# Patient Record
Sex: Female | Born: 1980 | Race: White | Smoking: Never smoker
Health system: Northeastern US, Academic
[De-identification: ages and names within clinical notes are randomized; demographics above are authoritative.]

## PROBLEM LIST (undated history)

## (undated) DIAGNOSIS — R87619 Unspecified abnormal cytological findings in specimens from cervix uteri: Secondary | ICD-10-CM

## (undated) DIAGNOSIS — IMO0002 Reserved for concepts with insufficient information to code with codable children: Secondary | ICD-10-CM

## (undated) DIAGNOSIS — I82409 Acute embolism and thrombosis of unspecified deep veins of unspecified lower extremity: Secondary | ICD-10-CM

## (undated) DIAGNOSIS — Z8679 Personal history of other diseases of the circulatory system: Secondary | ICD-10-CM

## (undated) DIAGNOSIS — F329 Major depressive disorder, single episode, unspecified: Secondary | ICD-10-CM

## (undated) DIAGNOSIS — I4891 Unspecified atrial fibrillation: Secondary | ICD-10-CM

## (undated) DIAGNOSIS — R002 Palpitations: Secondary | ICD-10-CM

## (undated) DIAGNOSIS — F419 Anxiety disorder, unspecified: Secondary | ICD-10-CM

## (undated) DIAGNOSIS — F32A Depression, unspecified: Secondary | ICD-10-CM

## (undated) DIAGNOSIS — H0013 Chalazion right eye, unspecified eyelid: Secondary | ICD-10-CM

## (undated) DIAGNOSIS — I471 Supraventricular tachycardia: Secondary | ICD-10-CM

## (undated) DIAGNOSIS — I4719 Other supraventricular tachycardia: Secondary | ICD-10-CM

## (undated) HISTORY — DX: Other supraventricular tachycardia: I47.19

## (undated) HISTORY — PX: KNEE SURGERY: SHX244

## (undated) HISTORY — DX: Chalazion right eye, unspecified eyelid: H00.13

## (undated) HISTORY — DX: Supraventricular tachycardia: I47.1

## (undated) HISTORY — PX: TUBAL LIGATION: SHX77

## (undated) HISTORY — PX: HERNIA REPAIR: SHX51

## (undated) HISTORY — DX: Anxiety disorder, unspecified: F41.9

## (undated) HISTORY — DX: Palpitations: R00.2

## (undated) HISTORY — PX: TONSILLECTOMY: SUR1361

## (undated) HISTORY — DX: Unspecified atrial fibrillation: I48.91

## (undated) HISTORY — PX: OTHER SURGICAL HISTORY: SHX169

## (undated) HISTORY — DX: Acute embolism and thrombosis of unspecified deep veins of unspecified lower extremity: I82.409

## (undated) HISTORY — PX: KNEE ARTHROCENTESIS: SUR44

## (undated) HISTORY — PX: KNEE ARTHROPLASTY: SHX992

---

## 2004-02-16 DIAGNOSIS — I82409 Acute embolism and thrombosis of unspecified deep veins of unspecified lower extremity: Secondary | ICD-10-CM

## 2004-02-16 HISTORY — DX: Acute embolism and thrombosis of unspecified deep veins of unspecified lower extremity: I82.409

## 2007-01-31 ENCOUNTER — Inpatient Hospital Stay (HOSPITAL_COMMUNITY): Admission: AD | Admit: 2007-01-31 | Discharge: 2007-01-31 | Payer: Self-pay | Admitting: Obstetrics and Gynecology

## 2007-02-04 ENCOUNTER — Inpatient Hospital Stay (HOSPITAL_COMMUNITY): Admission: AD | Admit: 2007-02-04 | Discharge: 2007-02-05 | Payer: Self-pay | Admitting: Obstetrics and Gynecology

## 2007-02-13 ENCOUNTER — Inpatient Hospital Stay (HOSPITAL_COMMUNITY): Admission: AD | Admit: 2007-02-13 | Discharge: 2007-02-15 | Payer: Self-pay | Admitting: Obstetrics and Gynecology

## 2007-12-13 ENCOUNTER — Emergency Department (HOSPITAL_COMMUNITY): Admission: EM | Admit: 2007-12-13 | Discharge: 2007-12-13 | Payer: Self-pay | Admitting: Family Medicine

## 2008-09-08 ENCOUNTER — Inpatient Hospital Stay (HOSPITAL_COMMUNITY): Admission: AD | Admit: 2008-09-08 | Discharge: 2008-09-09 | Payer: Self-pay | Admitting: Obstetrics and Gynecology

## 2008-09-09 ENCOUNTER — Inpatient Hospital Stay (HOSPITAL_COMMUNITY): Admission: AD | Admit: 2008-09-09 | Discharge: 2008-09-11 | Payer: Self-pay | Admitting: Obstetrics and Gynecology

## 2010-02-15 NOTE — L&D Delivery Note (Signed)
Delivery Note At 6:34 PM a viable female was delivered via precipitous Vaginal, Spontaneous Delivery (Presentation: Right Occiput Anterior) in the bed.  APGAR: 7, 9; weight 9 lb 1 oz (4111 g).   Placenta status: Intact, Spontaneous with 3VC.  Cord was clamped and cut and placed on mothers abdomen. Good uterine firming with fundal massage and pitocin. Cord:  with the following complications: None.  Cord pH: n/a  Anesthesia: Epidural  Episiotomy: None Lacerations: None Est. Blood Loss (mL): 350  Mom to postpartum.  Baby to nursery-stable.  Rachel Parsons 10/01/2010, 6:53 PM

## 2010-03-16 ENCOUNTER — Other Ambulatory Visit: Payer: Self-pay | Admitting: Obstetrics & Gynecology

## 2010-03-16 DIAGNOSIS — Z3682 Encounter for antenatal screening for nuchal translucency: Secondary | ICD-10-CM

## 2010-03-16 LAB — ABO/RH

## 2010-03-16 LAB — SYPHILIS: RPR W/REFLEX TO RPR TITER AND TREPONEMAL ANTIBODIES, TRADITIONAL SCREENING AND DIAGNOSIS ALGORITHM: RPR: NONREACTIVE

## 2010-03-16 LAB — HIV ANTIBODY (ROUTINE TESTING W REFLEX): HIV: NONREACTIVE

## 2010-03-16 LAB — RUBELLA ANTIBODY, IGM: Rubella: IMMUNE

## 2010-03-16 LAB — HEPATITIS B SURFACE ANTIGEN: Hepatitis B Surface Ag: NEGATIVE

## 2010-03-16 LAB — ANTIBODY SCREEN: Antibody Screen: NEGATIVE

## 2010-03-27 ENCOUNTER — Ambulatory Visit (HOSPITAL_COMMUNITY)
Admission: RE | Admit: 2010-03-27 | Discharge: 2010-03-27 | Disposition: A | Discharge status: Home / Self Care | Payer: Medicaid Other | Source: Ambulatory Visit | Attending: Obstetrics & Gynecology | Admitting: Obstetrics & Gynecology

## 2010-03-27 DIAGNOSIS — O351XX0 Maternal care for (suspected) chromosomal abnormality in fetus, not applicable or unspecified: Secondary | ICD-10-CM | POA: Insufficient documentation

## 2010-03-27 DIAGNOSIS — O3510X0 Maternal care for (suspected) chromosomal abnormality in fetus, unspecified, not applicable or unspecified: Secondary | ICD-10-CM | POA: Insufficient documentation

## 2010-03-27 DIAGNOSIS — Z3682 Encounter for antenatal screening for nuchal translucency: Secondary | ICD-10-CM

## 2010-03-27 DIAGNOSIS — Z3689 Encounter for other specified antenatal screening: Secondary | ICD-10-CM | POA: Insufficient documentation

## 2010-04-30 ENCOUNTER — Other Ambulatory Visit: Payer: Self-pay | Admitting: Obstetrics & Gynecology

## 2010-04-30 DIAGNOSIS — Z3689 Encounter for other specified antenatal screening: Secondary | ICD-10-CM

## 2010-05-08 ENCOUNTER — Ambulatory Visit (HOSPITAL_COMMUNITY): Payer: Medicaid Other

## 2010-05-08 ENCOUNTER — Encounter (HOSPITAL_COMMUNITY): Payer: Self-pay

## 2010-05-08 ENCOUNTER — Other Ambulatory Visit: Payer: Self-pay | Admitting: Obstetrics & Gynecology

## 2010-05-08 ENCOUNTER — Ambulatory Visit (HOSPITAL_COMMUNITY)
Admission: RE | Admit: 2010-05-08 | Discharge: 2010-05-08 | Disposition: A | Discharge status: Home / Self Care | Payer: Medicaid Other | Source: Ambulatory Visit | Attending: Obstetrics & Gynecology | Admitting: Obstetrics & Gynecology

## 2010-05-08 DIAGNOSIS — Z0489 Encounter for examination and observation for other specified reasons: Secondary | ICD-10-CM

## 2010-05-08 DIAGNOSIS — IMO0002 Reserved for concepts with insufficient information to code with codable children: Secondary | ICD-10-CM

## 2010-05-08 DIAGNOSIS — Z3689 Encounter for other specified antenatal screening: Secondary | ICD-10-CM | POA: Insufficient documentation

## 2010-05-18 ENCOUNTER — Other Ambulatory Visit: Payer: Self-pay | Admitting: Obstetrics & Gynecology

## 2010-05-18 ENCOUNTER — Ambulatory Visit (HOSPITAL_COMMUNITY)
Admission: RE | Admit: 2010-05-18 | Discharge: 2010-05-18 | Disposition: A | Discharge status: Home / Self Care | Payer: Medicaid Other | Source: Ambulatory Visit | Attending: Obstetrics & Gynecology | Admitting: Obstetrics & Gynecology

## 2010-05-18 DIAGNOSIS — O3500X0 Maternal care for (suspected) central nervous system malformation or damage in fetus, unspecified, not applicable or unspecified: Secondary | ICD-10-CM | POA: Insufficient documentation

## 2010-05-18 DIAGNOSIS — Z0489 Encounter for examination and observation for other specified reasons: Secondary | ICD-10-CM

## 2010-05-18 DIAGNOSIS — Z1389 Encounter for screening for other disorder: Secondary | ICD-10-CM | POA: Insufficient documentation

## 2010-05-18 DIAGNOSIS — O350XX0 Maternal care for (suspected) central nervous system malformation in fetus, not applicable or unspecified: Secondary | ICD-10-CM

## 2010-05-18 DIAGNOSIS — O358XX0 Maternal care for other (suspected) fetal abnormality and damage, not applicable or unspecified: Secondary | ICD-10-CM | POA: Insufficient documentation

## 2010-05-18 DIAGNOSIS — Z363 Encounter for antenatal screening for malformations: Secondary | ICD-10-CM | POA: Insufficient documentation

## 2010-05-24 LAB — RPR: RPR Ser Ql: NONREACTIVE

## 2010-05-24 LAB — CBC
MCHC: 33.9 g/dL (ref 30.0–36.0)
RDW: 13.6 % (ref 11.5–15.5)

## 2010-06-08 ENCOUNTER — Ambulatory Visit (HOSPITAL_COMMUNITY)
Admission: RE | Admit: 2010-06-08 | Discharge: 2010-06-08 | Disposition: A | Discharge status: Home / Self Care | Payer: Medicaid Other | Source: Ambulatory Visit | Attending: Obstetrics & Gynecology | Admitting: Obstetrics & Gynecology

## 2010-06-08 DIAGNOSIS — Z1389 Encounter for screening for other disorder: Secondary | ICD-10-CM | POA: Insufficient documentation

## 2010-06-08 DIAGNOSIS — Z363 Encounter for antenatal screening for malformations: Secondary | ICD-10-CM | POA: Insufficient documentation

## 2010-06-08 DIAGNOSIS — O350XX0 Maternal care for (suspected) central nervous system malformation in fetus, not applicable or unspecified: Secondary | ICD-10-CM

## 2010-06-08 DIAGNOSIS — O358XX0 Maternal care for other (suspected) fetal abnormality and damage, not applicable or unspecified: Secondary | ICD-10-CM | POA: Insufficient documentation

## 2010-06-16 HISTORY — PX: CARDIOVERSION: SHX1299

## 2010-06-29 ENCOUNTER — Emergency Department (HOSPITAL_COMMUNITY)
Admission: EM | Admit: 2010-06-29 | Discharge: 2010-06-29 | Disposition: A | Discharge status: Other Acute Inpatient Hospital | Payer: Medicaid Other | Source: Home / Self Care | Attending: Emergency Medicine | Admitting: Emergency Medicine

## 2010-06-29 ENCOUNTER — Ambulatory Visit (HOSPITAL_COMMUNITY)
Admission: RE | Admit: 2010-06-29 | Discharge: 2010-06-29 | Disposition: A | Discharge status: Home / Self Care | Payer: Medicaid Other | Source: Ambulatory Visit | Attending: Obstetrics and Gynecology | Admitting: Obstetrics and Gynecology

## 2010-06-29 ENCOUNTER — Inpatient Hospital Stay (HOSPITAL_COMMUNITY)
Admission: RE | Admit: 2010-06-29 | Discharge: 2010-06-29 | Disposition: A | Discharge status: Home / Self Care | Payer: Medicaid Other | Source: Ambulatory Visit

## 2010-06-29 ENCOUNTER — Inpatient Hospital Stay (HOSPITAL_COMMUNITY)
Admission: AD | Admit: 2010-06-29 | Discharge: 2010-07-01 | DRG: 781 | Disposition: A | Discharge status: Home / Self Care | Payer: Medicaid Other | Source: Other Acute Inpatient Hospital | Attending: Internal Medicine | Admitting: Internal Medicine

## 2010-06-29 DIAGNOSIS — O99891 Other specified diseases and conditions complicating pregnancy: Secondary | ICD-10-CM | POA: Insufficient documentation

## 2010-06-29 DIAGNOSIS — R5381 Other malaise: Secondary | ICD-10-CM | POA: Insufficient documentation

## 2010-06-29 DIAGNOSIS — Z79899 Other long term (current) drug therapy: Secondary | ICD-10-CM

## 2010-06-29 DIAGNOSIS — R002 Palpitations: Secondary | ICD-10-CM | POA: Insufficient documentation

## 2010-06-29 DIAGNOSIS — R5383 Other fatigue: Secondary | ICD-10-CM | POA: Insufficient documentation

## 2010-06-29 DIAGNOSIS — I4891 Unspecified atrial fibrillation: Secondary | ICD-10-CM | POA: Diagnosis present

## 2010-06-29 DIAGNOSIS — Z86718 Personal history of other venous thrombosis and embolism: Secondary | ICD-10-CM

## 2010-06-29 DIAGNOSIS — I2510 Atherosclerotic heart disease of native coronary artery without angina pectoris: Principal | ICD-10-CM | POA: Diagnosis present

## 2010-06-29 LAB — HEPATIC FUNCTION PANEL
Indirect Bilirubin: 0.1 mg/dL — ABNORMAL LOW (ref 0.3–0.9)
Total Protein: 7.2 g/dL (ref 6.0–8.3)

## 2010-06-29 LAB — BASIC METABOLIC PANEL
Chloride: 104 mEq/L (ref 96–112)
Creatinine, Ser: 0.5 mg/dL (ref 0.4–1.2)
GFR calc Af Amer: >60 mL/min (ref >60)
Potassium: 3.4 mEq/L — ABNORMAL LOW (ref 3.5–5.1)

## 2010-06-29 LAB — URINALYSIS, ROUTINE W REFLEX MICROSCOPIC
Glucose, UA: NEGATIVE mg/dL
Ketones, ur: 15 mg/dL — AB
Specific Gravity, Urine: 1.02 (ref 1.005–1.030)
pH: 7.5 (ref 5.0–8.0)

## 2010-06-29 LAB — DIFFERENTIAL
Basophils Relative: 0 % (ref 0–1)
Monocytes Relative: 4 % (ref 3–12)
Neutro Abs: 9.6 10*3/uL — ABNORMAL HIGH (ref 1.7–7.7)
Neutrophils Relative %: 78 % — ABNORMAL HIGH (ref 43–77)

## 2010-06-29 LAB — CBC
Hemoglobin: 12.7 g/dL (ref 12.0–15.0)
MCH: 30.4 pg (ref 26.0–34.0)
RBC: 4.18 MIL/uL (ref 3.87–5.11)
WBC: 12.4 10*3/uL — ABNORMAL HIGH (ref 4.0–10.5)

## 2010-06-30 ENCOUNTER — Ambulatory Visit: Payer: Medicaid Other | Admitting: Adult Health

## 2010-06-30 DIAGNOSIS — I4891 Unspecified atrial fibrillation: Secondary | ICD-10-CM | POA: Insufficient documentation

## 2010-06-30 DIAGNOSIS — G901 Familial dysautonomia [Riley-Day]: Secondary | ICD-10-CM | POA: Insufficient documentation

## 2010-06-30 DIAGNOSIS — I82409 Acute embolism and thrombosis of unspecified deep veins of unspecified lower extremity: Secondary | ICD-10-CM

## 2010-06-30 DIAGNOSIS — R002 Palpitations: Secondary | ICD-10-CM

## 2010-06-30 LAB — TSH
TSH: 1.823 u[IU]/mL (ref 0.350–4.500)
TSH: 2.208 u[IU]/mL (ref 0.350–4.500)

## 2010-06-30 LAB — D-DIMER, QUANTITATIVE
D-Dimer, Quant: 0.62 ug/mL-FEU — ABNORMAL HIGH (ref 0.00–0.48)
D-Dimer, Quant: 0.89 ug/mL-FEU — ABNORMAL HIGH (ref 0.00–0.48)

## 2010-06-30 NOTE — H&P (Signed)
NAMEGISSELA, BLOCH               ACCOUNT NO.:  [id number]   MEDICAL RECORD NO.:  [id number]          PATIENT TYPE:  INP   LOCATION:  9123                          [facility]:  [facility]   PHYSICIAN:  Naima A. Dillard, M.D. DATE OF BIRTH:  1980/05/29   DATE OF ADMISSION:  09/09/2008  DATE OF DISCHARGE:                              HISTORY & PHYSICAL   ASSESSMENT:  1. Intrauterine pregnancy at 31 and 1/7 weeks.  2. Group B Strep (GBS) positive with precipitous delivery before      antibiotic therapy could be implemented.  3. Desires postpartum bilateral tubal ligation (BTL).   HISTORY OF PRESENT ILLNESS:  Ms. Tweten is a 30 year old, married,  white female, gravida 6, para 3-1-1-4, 61 and 1/7 weeks for an [facility] of  September 22, 2008, who presented complete-complete, +1 station with a  bulging bag of water at 11:34 this a.m. She had been in Maternity  Admissions Unit last night and was discharged home after irregular  contractions and no cervical change.  After sleeping off and on, she  awoke and got in the shower at around 10:00 a.m. and started having  worsening contractions and then returned to the hospital around 11:30  a.m.  Positive fetal heart tones were noted in Maternity Admissions  Unit.  The patient with a strong urge to push.  Group beta Strep was  positive.  The patient was placed in hands and knees position on a  stretcher and was transferred from Maternity Admission Unit to L and D,  and as quickly as possible discussed with patient need for antibiotic  treatment secondary to GBS positive and patient agreed as long as could  receive some Stadol for pain.  As nurses were attempting to get supplies  ready to start IV, a spontaneous rupture of membranes occurred with  copious clear fluid at 11:41 and the patient's labor proceeded very  rapidly and SVD occurred at 11:43 a.m., a viable female infant named  Ivin Booty in hands and knees position.  Briefly before delivery, did get 1  to 2  minutes of fetal heart tones and fetal heart rate was 120s to 140s.  The newborn female had a spontaneous cry as delivered.  Apgars were 9 and  9.  Weight was 8 pounds even.  Length was 20 inches.  Placenta delivered  spontaneous grossly intact, Tomasa Blase.  The perineum and vagina were  inspected and no lacerations were noted.  Fundus was firm below  umbilicus.  EBL was approximately 250 mL.  The patient plans to breast  feed.  She desired circumcision for newborn female and did verbalize  desire for postpartum bilateral tubal ligation.  The patient was moving  about well and got up to the bathroom to help assist with some perineal  hygiene.  She was afebrile.  Her vital signs were stable.  Family was  bonding appropriately.  Mom and baby were stable in L and D Recovery.  Cord blood was collected and a 3-vessel cord was noted.   She had been followed by CNM Service at [facility].  History remarkable for:  1.  GBS positive.  2. History of DVT after having knee surgery and per MDs no prophylaxis      was needed during the pregnancy.  3. History of LDA x2.  4. Conception while breast-feeding so a questionable LMP, and EDC was      set by an ultrasound for August 8th.  5. History of rapid labor.  6. History of positive GBS.  7. Closely spaced pregnancies.  8. History of anemia.   PLAN:  Consulted with Dr. Normand Sloop after delivery and admission regarding  patient's request for a bilateral tubal ligation and was posted by Dr.  Normand Sloop for noon tomorrow on July 27th.  She will have routine  postpartum orders as well as routine preoperative orders and will be NPO  after midnight.  Dr. Normand Sloop verbalized plan to come to bedside to  consent for BTL, and nurses to notify pediatrician regarding lack of  treatment for GBS secondary to precipitous delivery and will monitor  baby closely.   ALLERGIES:  The patient has:  1. ERYTHROMYCIN allergy.  2. SULFA allergy.  3. No latex allergy.   MENSTRUAL HISTORY:   Menarche at age 26, monthly cycles, no  abnormalities, did have a questionable LMP, though, secondary to breast  feeding, and EDC was set by an ultrasound for September 22, 2008.   OBSTETRICAL HISTORY:  1. Gravida 1:  Spontaneous vaginal delivery, June of 2000, a female,      weighed 9 pounds, 2.5 ounces, [redacted] weeks gestation, 5 to 10 hours of      labor, she did have an epidural, his name is Bernette Redbird, it was in [place], no complications.  2. Gravida 2:  Spontaneous vaginal delivery, October of 2002, female,      Amil Amen, weighed 9 pounds, 2 ounces, 39 weeks, 3 hours of labor, did      have an epidural, no complications, [place].  3. Gravida 3:  Spontaneous vaginal delivery, less than 3 hours of      labor, January of 2005, a female, weighed 8 pounds, 9 ounces, [redacted]      weeks gestation, less than 3 hours of labor, epidural, named      Maisie Fus, no complications, and in [place].  4. Gravida 4:  A spontaneous abortion, 6 to [redacted] weeks gestation, March      of 2008, passed naturally, no complications.  5. Gravida 5:  Spontaneous vaginal delivery, December of 2008, a      female, weighed 7 pounds, 5 ounces at 36 weeks and 3 days, named      Violet Baldy.  6. Gravida 6 is current pregnancy.   PAST MEDICAL HISTORY:  1. History of abnormal Pap smear, 2006, that was repeated in 6 months      and was within normal limits.  2. She reports normal childhood illnesses.  3. A DVT, May of 2006, after knee surgery, took heparin and Coumadin      for 6 months, no other complications.  4. History of a hernia.  5. Occasional bladder infection.  6. Knee surgery, May of 2006.  7. Tonsillectomy in the past.  8. Wisdom teeth.  9. She has otherwise only been hospitalized for childbirth.   GENETIC HISTORY:  Unremarkable.   FAMILY HISTORY:  Paternal grandfather, paternal grandmother:  Chronic  hypertension.  Paternal grandfather:  Type 1 diabetes.  Maternal  grandmother:  Seizure.  Paternal grandmother:  Doreatha Martin disease.  Maternal grandmother:  Brain  cancer, she is deceased.  Father:  Nicotine.   SOCIAL HISTORY:  She is a married white female.  Her husband - I do not  have his name on here.  She is of [employer].  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  Denied alcohol, tobacco, or illicit drug use.   PRENATAL LABS:  Prenatal labs, which were drawn March 05, 2008,  hemoglobin 12.8, hematocrit 39.1, platelets 274.  Her blood type is O+.  RH antibody screen was negative.  RPR nonreactive.  Rubella titer  immune.  Hepatitis surface antigen negative.  HIV nonreactive.  Pap in  February of 2009 was normal.  Gonorrhea and Chlamydia cultures were  negative.   She entered care for her new OB workup on January 19th and she was  approximately 11 weeks and 4 days.  She desired CNM care.  She received  H1N1 prior to pregnancy, did desire a first trimester screen.  It was  completed February 3rd and was within normal limits.  She declined an  AFP, had anatomy ultrasound at 19 weeks and 1 day.  SIUP-EDC September 22, 2008, cervix 3.14, anterior placenta, normal anatomy and fluid, did  voice desire at the time for water birth.  One hour Glucola was within  normal limits at 27 weeks.  Feto fibronectin and GBS were done at 32 and  5/7 weeks and was negative and within normal limits, and cervix was  closed and long.  Was  given note for work to decrease length of shifts.  GBS was positive in third trimester.  Initially at 36 weeks, did have  discussion with patient about induction of labor at 39 weeks secondary  to GBS positive, history of rapid labor, and that patient lives in  [place], but nothing was ever posted.  Cervix was at least 2 at 37  weeks, 60%, minus 1.  She declined the membranes being swept that day  and then presented to MAU earlier in the morning of July 26th but was  sent home before returned complete.       Candice Lackawanna, CNM      Naima A. Normand Sloop, M.D.  Electronically Signed    CHS/MEDQ  D:  09/09/2008  T:  09/09/2008  Job:  [id number]

## 2010-06-30 NOTE — H&P (Signed)
Rachel Parsons, Rachel Parsons               ACCOUNT NO.:  [id number]   MEDICAL RECORD NO.:  [id number]          PATIENT TYPE:  INP   LOCATION:  9166                          [facility]   PHYSICIAN:  Rachel Fat. Parsons, M.D. DATE OF BIRTH:  01/07/1981   DATE OF ADMISSION:  02/13/2007  DATE OF DISCHARGE:                              HISTORY & PHYSICAL   Rachel Parsons is a 30 year old, gravida 5, para 3-0-1-3, at 37-5/7 weeks  who presented complaining of uterine contractions every 4-6 minutes for  several hours.  She has had no leaking or bleeding and reports positive  fetal movement. Cervix is 3 cm today in the office.  Pregnancy has been  remarkable for  1. Positive group B strep.  2. History of rapid labor x2.  3. History of LGA x2.  4. History of DVT after knee surgery.   PRENATAL LABORATORY DATA:  Blood type O positive.  Rh antibody negative,  VDRL nonreactive, rubella titer positive, hepatitis B surface antigen  negative, cystic fibrosis testing negative, HIV not noted on her chart.  Hemoglobin upon entry to practice 12.1; it was within normal limits at  28 weeks. Glucola was normal also at 28 weeks.  AFP was normal.  She has  history of negative thrombophilia studies following her DVT. Group B  strep culture was positive at 36 weeks.   HISTORY OF PRESENT PREGNANCY:  The patient entered care at approximately  22 weeks. She was a transfer from [place].  She had a perinatal consult  during the early part of her pregnancy secondary to history of DVT.  She  was seen by Dr. Anselm Jungling who reviewed her records.  All of her thrombophilia  workup was negative.  Her LAC, ACA were all negative, and the patient  did not require any anticoagulation.  She was anxious throughout her  pregnancy regarding her history of rapid labor and did request induction  at 38-39 weeks.  Group B strep culture was negative at 36 weeks.   OBSTETRICAL HISTORY:  1. In 2000, she had vaginal birth of a female infant, weight 9  pounds 2-      1/2 ounces at 40 weeks.  She was in labor 5-10 hours.  She had      epidural anesthesia.  That child was born in [place].  2. In 2002, she had a vaginal birth of a female infant, weight 9      pounds 2 ounces at 39 weeks.  She was in labor 3 hours.  She had no      complications.  She used no pain medications.  That child was also      born in [place].  3. In 2005, she had a vaginal birth of a female infant, weight 8 pounds      9 ounces at 38 weeks. She was in labor less than 3 hours.  She had      no anesthesia.  That child was born in [place].  4. In 2008, she had a 6 to 7-week miscarriage which passed naturally.   PAST MEDICAL  HISTORY:  1. Previous Depo-Provera user and birth control pills.  2. She had an abnormal PAP in 2006, but repeat Pap's were normal every      6 months from that time.  3. She reports usual childhood illnesses.  4. She does have a history of varicosities in the past and had a DVT      in May 2006 after knee surgery.  She was put on heparin and      Coumadin for 6 months and has had no problems since.  5. She had a negative thrombophilia workup in the early part of this      pregnancy.  6. She does have a history of a hernia in the past.  7. She has had occasional UTIs.   PAST SURGICAL HISTORY:  1. Knee surgery in May 2007.  2. Tonsils, wisdom teeth in the past.   She is sensitive to ERYTHROMYCIN and SULFA.  These were allergies noted  in childhood.   FAMILY HISTORY:  Paternal grandfather and paternal grandmother had  hypertension.  Her paternal grandfather had type 2 diabetes.  Her  maternal grandmother had seizures.  Her paternal grandmother had Hermenia Fiscal disease.  Her maternal grandmother had brain cancer and is now  deceased.  Maternal grandfather had throat cancer and is now deceased.  Her father is a smoker.   SOCIAL HISTORY:  The patient is married to the father of the baby.  He  is involved and supportive.  His name is Psychologist, prison and probation services.  The patient is  Caucasian, Development worker, international aid.  She has 1-1/2 years of college.  She is not  currently employed at this time.  Her husband has a bachelor's degree.  He is a Runner, broadcasting/film/video.  She has been followed by the certified nurse midwife  service of [facility].  She denies any alcohol, drug,  or tobacco use during this pregnancy.   PHYSICAL EXAMINATION:  VITAL SIGNS:  Pulse 112.  Other vital signs are  stable.  The patient is afebrile.  HEENT:  Within normal limits.  LUNGS:  Breath sounds are clear.  HEART:  Regular rate and rhythm without murmur.  BREASTS:  Soft and nontender.  ABDOMEN:  Fundal height is approximately 38 cm.  Estimated fetal weight  is 7-8 pounds.  Uterine contractions are every 4-6 minutes, moderate  quality.  Cervix is 4-5 cm, 80%, vertex, at a -1 station with slightly  bulging bag of water.  Fetal heart rate is 150-160 with accelerations  noted.  EXTREMITIES:  Deep tendon reflexes are 2+ without clonus.  There is  trace edema noted.   IMPRESSION:  1. Intrauterine pregnancy at 37-38 weeks.  2. Early labor.  3. Positive group B Streptococcus.  4. History of rapid labor.   PLAN:  1. Admit to birthing suite for consult with Dr. Silverio Lay as      attending physician.  2. Routine certified nurse midwife orders.  3. Will plan group B strep prophylaxis with penicillin G per standard      dosing.  4. Offer artificial rupture of membranes for labor support.  5. The patient declines pain medication at present.      Renaldo Parsons Rachel Hero, C.N.M.      Rachel Parsons, M.D.  Electronically Signed    VLL/MEDQ  D:  02/13/2007  T:  02/13/2007  Job:  [id number]

## 2010-07-01 ENCOUNTER — Inpatient Hospital Stay (HOSPITAL_COMMUNITY): Payer: Medicaid Other

## 2010-07-01 DIAGNOSIS — R000 Tachycardia, unspecified: Secondary | ICD-10-CM

## 2010-07-01 DIAGNOSIS — R0602 Shortness of breath: Secondary | ICD-10-CM

## 2010-07-01 MED ORDER — IOHEXOL 300 MG/ML  SOLN
70.0000 mL | Freq: Once | INTRAMUSCULAR | Status: AC | PRN
  Administered 2010-07-01: 70 mL via INTRAVENOUS

## 2010-07-03 NOTE — Consult Note (Signed)
NAMELAINI, URICK               ACCOUNT NO.:  [id number]  MEDICAL RECORD NO.:  [id number]           PATIENT TYPE:  I  LOCATION:  4737                         FACILITY:  [facility]  PHYSICIAN:  Eliyahu Bille S. Shawnie Pons, M.D.   DATE OF BIRTH:  [birth year]  DATE OF CONSULTATION: DATE OF DISCHARGE:                                CONSULTATION   REASON FOR CONSULTATION:  Obstetric management, a pregnant patient with atrial fibrillation.  RECOMMENDATIONS: 1. Agree with ruling out DVT. 2. If lower extremity Dopplers are negative, would move to spiral CT     scan to rule out PE. 3. D-dimers have limited value in pregnancy since this test is always     positive, not allowing [place] to determine risk stratification and      treatment options for the patient. 4. Fetal heart rate daily.  Would recommend monitoring in the PACU     following DC cardioversion as needed. 5. Rate control, may use beta blockers, calcium channel blockers,     digoxin if necessary, avoid atenolol, all other beta blockers are     okay for pregnancy.  Lovenox as anticoagulation option of choice,     given coumadin is contraindicated in pregnancy. 6. Viable fetus, should fetal distress or evidence of that become     apparent, delivery is an option. 7. Would consider betamethasone course if the patient becomes acutely     worse or delivery seems to be eminent.  HISTORY OF PRESENT ILLNESS:  The patient is a 30 year old gravida 8, para 5-0-2-5 who is currently 25 weeks' gestation, dated by 10-week ultrasound.  Her prenatal course has been fairly normal until she came in with palpitations on the day of admission.  The patient was then sent to [facility] where she underwent EKG, which showed atrial fibrillation with rapid ventricular response.  The patient was brought to [facility] for admission.  The patient was seen by Cardiology and was noted to have a history of DVT.  She was on no anticoagulation as DVT occurred with prior  knee surgery as that was felt to be the inciting factor.  She is still on telemetry.  She was given diltiazem and she got a dose of metoprolol at [facility] for rate control.  She was given weight-based dose of subcu Lovenox on the evening of admission.  HOSPITAL COURSE:  As far as the patient has received diltiazem in the hospital, her heart rate continues to be in the 130s-140s.  Fetal heart rate has been monitored x2 and is reactive.  She is reporting excellent fetal movement, so far she is not really short of breath.  She has no significant chest pain.  She denies nausea, vomiting, diarrhea, or constipation.  PHYSICAL EXAMINATION:  VITAL SIGNS:  Blood pressure is 100/63, pulse rate of 130s-150s presently.  She is afebrile. GENERAL:  She is a well-developed, well-nourished female.  She is somewhat anxious-appearing. HEENT:  Normocephalic and atraumatic.  Sclera without icterus. NECK:  Supple.  Normal thyroid. LUNGS:  Clear bilaterally. CV:  Irregularly irregular, tachycardiac.  Difficult to hear good heart sounds given repetitivity of  rhythm and irregularity. ABDOMEN:  Soft, nontender.  Uterus is approximately 25 cm and consistent with days and nontender. EXTREMITIES:  No cyanosis, clubbing, or erythema.  Negative Homans.  PERTINENT LABS:  TSH is 1.823, normal.  Urinalysis is also normal.  CBC shows white count 12.4, hemoglobin 12.7.  BMET is normal.  LFTs are within normal limits.  Last ultrasound on June 08, 2010, showed a 65th percentile baby 604 grams, 1 pound 5 ounces in a breech presentation with normal fluid.  Cervix was 3.2 cm.  PAST MEDICAL HISTORY:  Significant for DVT following arthroscopy with a negative domiciliary workup after that.  PAST SURGICAL HISTORY:  1. Knee arthroscopy  2. D&C for an elective AB. 3. Tonsillectomy.  MEDICATIONS:  Presently on, 1. Verapamil 40 mg p.o. q.4 h. 2. Lovenox 70 mg subcu q.12 3. Zofran p.r.n. 4. Acetaminophen p.r.n. 5.  Prenatal vitamin daily.  ALLERGIES:  SULFA and ERYTHROMYCIN.  FAMILY HISTORY:  Hypertension, diabetes, coronary artery disease.  SOCIAL HISTORY:  She is married.  She works as an Public house manager.  She denies tobacco, alcohol, or drug use.  OB HISTORY:  She is [id number] with miscarriage, elective termination, 5-term vaginal healthy deliveries.  GYN HISTORY:  Negative.  IMPRESSION: 1. A 25 weeks' gestation, but stable. 2. Atrial fibrillation with rapid ventricular response, unclear     trigger. 3. History of deep vein thrombosis with difficult to control rate.  PLAN:  We will allow cardiology to continue to do whatever procedures are needed.  She is scheduled for TEE and DC cardioversion.  See above for further recommendations regarding medications in this issue. Additionally, we will be happy to take the patient at [facility] when acute cardiology issues are resolved or when they feel it is appropriate for her to be over there.  We are happy to take her on our service.  As always, please call [phone] for a direct line to the [facility] attending on-call for the patient.  The patient would additionally have NST daily to follow her pregnancy and more frequently with procedures.  See above for further recommendations regarding workup and question of PE/DVT.     Shelbie Proctor. Shawnie Pons, M.D.     TSP/MEDQ  D:  06/30/2010  T:  07/01/2010  Job:  [id number]  Electronically Signed by Tinnie Gens M.D. on 07/03/2010 10:29:30 AM

## 2010-07-22 NOTE — H&P (Signed)
Rachel Parsons, Rachel Parsons               ACCOUNT NO.:  [id number]  MEDICAL RECORD NO.:  [id number]           PATIENT TYPE:  I  LOCATION:  4737                          [facility]  PHYSICIAN:  Cherene Altes, MD     DATE OF BIRTH:  [birth year]  DATE OF ADMISSION:  06/29/2010 DATE OF DISCHARGE:                             HISTORY & PHYSICAL   PRIMARY CARDIOLOGIST:  Bevelyn Buckles. Bensimhon, MD.  PRIMARY PHYSICIAN:  Dr. Lily Peer with OB.  CHIEF COMPLAINT:  "I feel my heart pounding in my chest."  HISTORY OF PRESENT ILLNESS:  Ms. Popowski is a 30 year old white female at 77 weeks' gestation with a past medical history of DVT after knee surgery as well as five previous healthy pregnancies.  She presented to her obstetrician today, complaining of nonspecific fatigue, dyspnea, and intermittent presyncope since Friday.  On the way to her appointment, she felt sudden severe palpitations like her heart was "pounding out of her chest."  The patient also states that she has had intermittent numbness and tingling of her bilateral hands throughout today.  The EKG obtained at the time of her visit showed atrial fibrillation with a rate of 137.  She was initially admitted to the [facility] emergency department where she was treated with 5 mg of IV Lopressor and subsequently transferred here for further management.  At this time, the patient states that she only feels occasional episodes of dizziness though none at present, she does continue to feel intermittent severe palpitations.  PAST MEDICAL HISTORY: 1. DVT after knee surgery. 2. Five previous healthy pregnancies.  SOCIAL HISTORY:  Married with 5 children.  No tobacco, alcohol, or drug use.  FAMILY HISTORY:  No premature coronary artery disease or sudden cardiac death.  ALLERGIES:  SULFA.  MEDICATIONS:  Prenatal vitamins and Tylenol p.r.n.  REVIEW OF SYSTEMS:  No fever, GI, or GU symptoms.  All other systems are negative except for  pertinent positives and negatives as described in the HPI.  PHYSICAL EXAM:  VITAL SIGNS:  Temperature 98.3, pulse 113, respiratory rate 18, blood pressure 100/63, O2 sats 98% on 2 liters nasal cannula. GENERAL:  Pleasant, well-appearing, pregnant white female, alert and oriented x3, in no acute distress. HEENT:  Normocephalic, atraumatic.  Extraocular movements intact. Mucous membranes moist. NECK:  Supple without lymphadenopathy.  No JVD. CARDIOVASCULAR:  Irregularly irregular, tachycardic.  Normal S1 and S2. No murmurs, gallops, or rubs.  Pulses 2+ and equal bilaterally. LUNGS:  Clear to auscultation bilaterally. SKIN:  Warm and dry. ABDOMEN:  Consistent with a 25 weeks' gestation pregnancy.  No tenderness to palpation. EXTREMITIES:  Trace edema in bilateral lower extremities. MUSCULOSKELETAL:  No bony deformity. NEURO:  Alert and oriented x3.  Cranial nerves II-XII intact.  EKG shows atrial fibrillation with a rate of 138.  White count of 12.4, hemoglobin is 12.7, hematocrit 38.2, platelets 228.  Electrolytes and renal function are within normal limits.  Liver function panel is within normal limits.  Urinalysis is negative.  IMPRESSION AND PLAN:  Atrial fibrillation with rapid ventricular response in the setting of 25 weeks' gestation.  The patient has had  nonspecific symptoms x4 days.  There are more severe palpitations starting today.  She does have a history of deep vein thrombosis in the setting of her previous knee surgery, though she is not on anticoagulation due to the fact that this was in the setting of a surgery.  She has had two pregnancies subsequently without any recurrence of thromboembolism.  As she has a history of deep vein thrombosis, pulmonary embolism is a concern as the trigger for the atrial fibrillation.  Currently, her heart rate is 110-135 at rest with a blood pressure of 100/63.  For now, we will monitor her on telemetry and treat her with Lovenox 1  mg/kg subcu.  We will start verapamil 30 mg p.o. q.6 h. for rate control and titrate this as her blood pressure tolerates.  A venous Doppler and PH will be obtained.  The patient will be continued on her prenatal vitamin.  The remainder of her prenatal care will be per OB.  The case has been discussed with Dr. Graciela Husbands of EP and Dr. Shawnie Pons with OB.     Cherene Altes, MD     PS/MEDQ  D:  06/29/2010  T:  06/30/2010  Job:  [id number]  Electronically Signed by Cherene Altes M.D. on 07/22/2010 08:23:51 PM

## 2010-07-22 NOTE — Discharge Summary (Signed)
NAMEVALLERI, Rachel Parsons               ACCOUNT NO.:  [id number]  MEDICAL RECORD NO.:  [id number]           PATIENT TYPE:  I  LOCATION:  4737                         FACILITY:  [facility]  PHYSICIAN:  Duke Salvia, MD, FACCDATE OF BIRTH:  05-Oct-1980  DATE OF ADMISSION:  06/29/2010 DATE OF DISCHARGE:  07/01/2010                              DISCHARGE SUMMARY   PRIMARY CARDIOLOGIST:  Duke Salvia, MD, [facility].  OBSTETRICIAN:  Lazaro Arms, MD in [place].  DISCHARGE DIAGNOSES: 1. Paroxysmal atrial fibrillation with spontaneous conversion to sinus     rhythm. 2. History of deep venous thrombosis. 3. A 25 weeks' gestation.  REASON FOR HOSPITALIZATION:  This is a 30 year old female at 87 weeks' gestation and past medical history significant for DVT post knee surgery.  The patient presented to her obstetrician on day of admission complaining of specific fatigue, dyspnea, and lightheadedness over the past week.  The patient states she felt her heart pounding out of her chest.  She also complained of bilateral numbness and tingling in her hands throughout the day.  An EKG was obtained that showed atrial fibrillation at a rate of 137 beats per minute.  The patient was seen at the [facility] Emergency Department and given 5 mg of IV Lopressor.  The patient was transferred to [facility] for further management.  HOSPITAL COURSE: The patient was placed on the telemetry unit. She was initiated on Lovenox 1 mg/kg subcutaneously twice daily.  Her heart rates at [facility] remain between 110-135 on telemetry.  Her pressures were stable.  The patient was started on verapamil 30 mg q.6 h. for rate control.  Venous Dopplers and echocardiogram were ordered.  This case was discussed with Dr. Graciela Husbands as well as Dr. Shawnie Pons with OB.  It was determined that the patient would need TEE with direct current cardioversion and anticoagulation for at least 4 weeks post cardioversion.  Dr. Shawnie Pons from obstetrics evaluated the  patient while at [facility].  She agreed with scheduled TEE and direct current cardioversion. She also agreed with ruling out DVT and PE.  She noted if lower extremity Dopplers were negative, then she would move to spiral CT scan rule out PE.  Dr. Graciela Husbands did order a D-dimer, although this had limited value in pregnancy since they are always positive.  It was also noted that Lovenox as anticoagulation is option of choice.  On Jun 30, 2010, the patient underwent a TEE by Dr. Shirlee Latch.  During this procedure, the patient converted to normal sinus rhythm with passing of the probe.  Therefore, no direct current cardioversion was necessary. It was noted that the patient should be continued on Lovenox 1 mg/kg twice daily for anticoagulation at least 1 month.  Of note, no LAA thrombus was noted, therefore, may cardiovert in the future if she does not remain in sinus rhythm as long as her anticoagulation is maintained. The fetus was evaluated before and after the procedure by OB nurse and remained stable.  The patient underwent bilateral lower extremity venous Dopplers.  This showed no evidence of deep vein or superficial thrombosis of the right or left lower  extremity.  No evidence of Baker cyst on the right or left.  CT angiogram of the chest was also obtained, this was negative for pulmonary emboli.  Her pulmonary artery pacification is moderate. There was noted to be scattered subpleural nodules in both lungs, none larger than 4 mm that were felt likely benign related to old granulomatous infection.  Further workup can be per primary care provider.  On the day of discharge, Dr. Graciela Husbands evaluated the patient and then discharged stable for home.  She had ruled out for pulmonary embolus. The patient had remained in sinus rhythm on telemetry.  The patient states her shortness of breath had improved.  PROCEDURES/DIAGNOSTICS PERFORMED DURING HOSPITALIZATION: 1. A 2-D echo on Jun 30, 2010:  Left  ventricular systolic function was     normal.  Estimated ejection fraction 55%.  No regional wall motion     abnormalities. 2. Transesophageal echo on Jul 01, 2010:  Left ventricular systolic     function was normal, estimated ejection fraction 55%-60%.  No wall     motion abnormalities.  Grade 1 diastolic dysfunction.  No ASD or     PFO by color.  No left atrial appendage thrombus. 3. CT angio of the chest:  No pulmonary emboli.  Pulmonary arterial     pacification is moderate.  There was noted to be atelectasis and/or     scarring in the lungs bilaterally.  Areas of ground-glass opacities     suggesting respiratory bronchitis.  Old granulomatous infection     with calcified nodule within the left hilum and in subcarinal     region.  Scattered subpleural nodules in both lungs, none larger     than 4 mm, likely benign and related to old granulomatous     infection.  DISCHARGE LABORATORY DATA:  D-dimer 0.62.  TSH 2.208.  WBC 12.4, hemoglobin 12.7, hematocrit 38.2, platelets 228.  Sodium 137, potassium 3.4, BUN 8, creatinine 0.50.  DISCHARGE MEDICATIONS AND INSTRUCTIONS: 1. Enoxaparin 80 mg/0.8 mL injections 70 mg subcutaneously twice     daily. 2. Prenatal vitamins.  FOLLOWUP PLANS AND INSTRUCTIONS: 1. The patient will follow up with Dr. Graciela Husbands in 4 weeks, the office     will call to schedule this appointment. 2. The patient will follow up with Dr. Despina Hidden as previously scheduled,     the patient is going to call the office tomorrow. 3. The patient is to increase activity as tolerated. 4. The patient is to call the office for any problems or concerns in     the interim.  DURATION OF DISCHARGE:  Greater than 30 minutes with physician and physician extender time.     Leonette Monarch, PA-C   ______________________________ Duke Salvia, MD, [facility]    NB/MEDQ  D:  07/01/2010  T:  07/02/2010  Job:  [id number]  cc:   Lazaro Arms, M.D.  Electronically Signed by Alen Blew  P.A. on 07/08/2010 02:19:12 PM Electronically Signed by Sherryl Manges MD [facility] on 07/22/2010 07:35:40 AM

## 2010-07-23 ENCOUNTER — Encounter (HOSPITAL_COMMUNITY): Payer: Self-pay | Admitting: Radiology

## 2010-07-23 ENCOUNTER — Emergency Department (HOSPITAL_COMMUNITY)
Admission: EM | Admit: 2010-07-23 | Discharge: 2010-07-23 | Disposition: A | Discharge status: Home / Self Care | Payer: Medicaid Other | Attending: Emergency Medicine | Admitting: Emergency Medicine

## 2010-07-23 ENCOUNTER — Emergency Department (HOSPITAL_COMMUNITY): Payer: Medicaid Other

## 2010-07-23 DIAGNOSIS — R0609 Other forms of dyspnea: Secondary | ICD-10-CM | POA: Insufficient documentation

## 2010-07-23 DIAGNOSIS — R0602 Shortness of breath: Secondary | ICD-10-CM | POA: Insufficient documentation

## 2010-07-23 DIAGNOSIS — R0989 Other specified symptoms and signs involving the circulatory and respiratory systems: Secondary | ICD-10-CM | POA: Insufficient documentation

## 2010-07-23 DIAGNOSIS — O2690 Pregnancy related conditions, unspecified, unspecified trimester: Secondary | ICD-10-CM | POA: Insufficient documentation

## 2010-07-23 LAB — CBC
HCT: 35.2 % — ABNORMAL LOW (ref 36.0–46.0)
Hemoglobin: 11.9 g/dL — ABNORMAL LOW (ref 12.0–15.0)
MCHC: 33.8 g/dL (ref 30.0–36.0)
MCV: 90.7 fL (ref 78.0–100.0)
RDW: 13.4 % (ref 11.5–15.5)

## 2010-07-23 LAB — BASIC METABOLIC PANEL
CO2: 23 mEq/L (ref 19–32)
Calcium: 9.4 mg/dL (ref 8.4–10.5)
Creatinine, Ser: 0.48 mg/dL (ref 0.4–1.2)
GFR calc Af Amer: >60 mL/min (ref >60)

## 2010-07-23 LAB — DIFFERENTIAL
Eosinophils Relative: 2 % (ref 0–5)
Lymphocytes Relative: 29 % (ref 12–46)
Lymphs Abs: 2.9 10*3/uL (ref 0.7–4.0)
Monocytes Absolute: 0.6 10*3/uL (ref 0.1–1.0)
Monocytes Relative: 6 % (ref 3–12)
Neutro Abs: 6.3 10*3/uL (ref 1.7–7.7)

## 2010-07-24 ENCOUNTER — Emergency Department (HOSPITAL_COMMUNITY)
Admission: EM | Admit: 2010-07-24 | Discharge: 2010-07-24 | Disposition: A | Discharge status: Home / Self Care | Payer: Medicaid Other | Attending: Emergency Medicine | Admitting: Emergency Medicine

## 2010-07-24 ENCOUNTER — Emergency Department (HOSPITAL_COMMUNITY): Payer: Medicaid Other

## 2010-07-24 DIAGNOSIS — R000 Tachycardia, unspecified: Secondary | ICD-10-CM | POA: Insufficient documentation

## 2010-07-24 DIAGNOSIS — O99891 Other specified diseases and conditions complicating pregnancy: Secondary | ICD-10-CM | POA: Insufficient documentation

## 2010-07-24 DIAGNOSIS — R5381 Other malaise: Secondary | ICD-10-CM | POA: Insufficient documentation

## 2010-07-24 DIAGNOSIS — R0602 Shortness of breath: Secondary | ICD-10-CM | POA: Insufficient documentation

## 2010-07-24 DIAGNOSIS — Z79899 Other long term (current) drug therapy: Secondary | ICD-10-CM | POA: Insufficient documentation

## 2010-07-24 DIAGNOSIS — R5383 Other fatigue: Secondary | ICD-10-CM | POA: Insufficient documentation

## 2010-07-24 LAB — CBC
Hemoglobin: 12 g/dL (ref 12.0–15.0)
MCHC: 34.3 g/dL (ref 30.0–36.0)
Platelets: 218 10*3/uL (ref 150–400)
RDW: 13.2 % (ref 11.5–15.5)

## 2010-07-24 LAB — BASIC METABOLIC PANEL
Glucose, Bld: 134 mg/dL — ABNORMAL HIGH (ref 70–99)
Potassium: 3.5 mEq/L (ref 3.5–5.1)
Sodium: 135 mEq/L (ref 135–145)

## 2010-07-24 LAB — DIFFERENTIAL
Basophils Absolute: 0 10*3/uL (ref 0.0–0.1)
Basophils Relative: 0 % (ref 0–1)
Eosinophils Absolute: 0.1 10*3/uL (ref 0.0–0.7)
Monocytes Absolute: 0.7 10*3/uL (ref 0.1–1.0)
Neutro Abs: 7.3 10*3/uL (ref 1.7–7.7)
Neutrophils Relative %: 70 % (ref 43–77)

## 2010-07-24 LAB — URINALYSIS, ROUTINE W REFLEX MICROSCOPIC
Nitrite: NEGATIVE
Protein, ur: NEGATIVE mg/dL
Specific Gravity, Urine: 1.027 (ref 1.005–1.030)
Urobilinogen, UA: 1 mg/dL (ref 0.0–1.0)

## 2010-07-24 LAB — URINE MICROSCOPIC-ADD ON

## 2010-07-24 MED ORDER — IOHEXOL 300 MG/ML  SOLN
80.0000 mL | Freq: Once | INTRAMUSCULAR | Status: AC | PRN
  Administered 2010-07-24: 80 mL via INTRAVENOUS

## 2010-07-29 ENCOUNTER — Encounter: Payer: Self-pay | Admitting: Internal Medicine

## 2010-07-29 ENCOUNTER — Ambulatory Visit (INDEPENDENT_AMBULATORY_CARE_PROVIDER_SITE_OTHER): Payer: Medicaid Other | Admitting: Internal Medicine

## 2010-07-29 DIAGNOSIS — I4891 Unspecified atrial fibrillation: Secondary | ICD-10-CM

## 2010-07-29 DIAGNOSIS — R002 Palpitations: Secondary | ICD-10-CM

## 2010-07-29 MED ORDER — METOPROLOL SUCCINATE ER 25 MG PO TB24: 25.0000 mg | ORAL_TABLET | Freq: Every day | ORAL | Status: DC

## 2010-07-29 MED ORDER — METOPROLOL TARTRATE 25 MG PO TABS: 25.0000 mg | ORAL_TABLET | Freq: Two times a day (BID) | ORAL | Status: DC

## 2010-07-29 MED ORDER — NADOLOL 20 MG PO TABS: 20.0000 mg | ORAL_TABLET | Freq: Every day | ORAL | Status: DC

## 2010-07-29 NOTE — Assessment & Plan Note (Addendum)
I suspect that the patient has postural orthostatic tachycardia based on her symptoms. I am not quite sure why it is manifested at this point in her pregnancy although she is quite large with her baby. This can be associated with the IVC compression and this is quite common in the third trimester. Her diet is to complete a salt and water and this will be the first target of therapy.  In addition, a different beta blocker may be of help. The labetalol as an alpha blocker and vasoconstriction is an important part of therapy. To that end we will plan to give her a trial of metoprolol 25, metoproolol succ 25, and  nadolol 20. All of these are class  C.  We spent close to 45 minutes discussing the physiology and counseling as relates to the diagnosis.  I have also given her the website for [employer] and [employer]DR[employer].org I've asked her to have her OB/GYN contact me so we can have the medication.

## 2010-07-29 NOTE — Patient Instructions (Addendum)
Your physician has recommended you make the following change in your medication: you are being given prescriptions for 3 different medications to try in 2 week increments. You may take them in any order, just do not take them at the same time. 1) metoprolol tartrate 25mg  one tablet twice daily 2) metoprolol succinate 25mg  one tablet daily. 3) nadolol 20mg  one tablet daily.  - STOP labetolol and lovenox   Your physician recommends that you schedule a follow-up appointment in: 3 months

## 2010-07-29 NOTE — Progress Notes (Signed)
  HPI  Rachel Parsons is a 30 y.o. female Seen following an episode of atrial fibrillation for which she was hospitalized and underwent TEE guided cardioversion. She was in her third trimester at that point.  She has had problems with persistent shortness of breath and tachycardia palpitations since then. She was seen back in the emergency room a another CT scan was done excluding pulmonary embolism She finds that she hasheat associated tachycardia, orthostatic tachycardia, shower tachycardia as well as exertion. Her diet is depleted of salt and also fluids.  Past Medical History  Diagnosis Date  . Palpitation   . DVT (deep venous thrombosis)     after knee surgery  . Atrial fibrillation     Past Surgical History  Procedure Date  . Childbirth     times 5  . Tonsillectomy   . Knee arthroplasty     Current Outpatient Prescriptions  Medication Sig Dispense Refill  . acetaminophen (TYLENOL) 325 MG tablet Take 650 mg by mouth every 6 (six) hours as needed.        . enoxaparin (LOVENOX) 80 MG/0.8ML SOLN Inject 70 mg/kg into the skin every 12 (twelve) hours.        Marland Kitchen labetalol (NORMODYNE) 100 MG tablet Take 100 mg by mouth 2 (two) times daily.        . Prenatal Vit-Iron Carbonyl-FA (PRENATABS RX PO) Take 1 tablet by mouth daily.          Allergies  Allergen Reactions  . Sulfa Antibiotics     Review of Systems negative except from HPI and PMH  Physical Exam Well developed and well nourished in no acute distress HENT normal E scleral and icterus clear Neck Supple JVP flat; carotids brisk and full Clear to ausculation Regular rate and rhythm, no murmurs gallops or rub gravid No clubbing cyanosis and edema Alert and oriented, grossly normal motor and sensory function Skin Warm and Dry  ECG sinus rhythm at 105 Intervals 136/07/3 3 Otherwise normal  Assessment and  Plan

## 2010-07-29 NOTE — Assessment & Plan Note (Signed)
In recurrent atrial fibrillation. She is now 4 weeks post cardioversion. We will stop her Lovenox.

## 2010-08-04 ENCOUNTER — Encounter: Payer: Medicaid Other | Admitting: Internal Medicine

## 2010-08-11 ENCOUNTER — Telehealth: Payer: Self-pay | Admitting: Internal Medicine

## 2010-08-11 DIAGNOSIS — R002 Palpitations: Secondary | ICD-10-CM

## 2010-08-11 DIAGNOSIS — I4891 Unspecified atrial fibrillation: Secondary | ICD-10-CM

## 2010-08-11 MED ORDER — METOPROLOL TARTRATE 25 MG PO TABS: 25.0000 mg | ORAL_TABLET | Freq: Two times a day (BID) | ORAL | Status: DC

## 2010-08-11 NOTE — Telephone Encounter (Signed)
[facility] [place] [phone] needs refill of metoprolol tartrate 25mg 

## 2010-09-12 ENCOUNTER — Encounter (HOSPITAL_COMMUNITY): Payer: Self-pay | Admitting: *Deleted

## 2010-09-12 ENCOUNTER — Inpatient Hospital Stay (HOSPITAL_COMMUNITY)
Admission: AD | Admit: 2010-09-12 | Discharge: 2010-09-12 | Disposition: A | Discharge status: Home / Self Care | Payer: Medicaid Other | Source: Ambulatory Visit | Attending: Obstetrics & Gynecology | Admitting: Obstetrics & Gynecology

## 2010-09-12 DIAGNOSIS — Z3480 Encounter for supervision of other normal pregnancy, unspecified trimester: Secondary | ICD-10-CM

## 2010-09-12 HISTORY — DX: Personal history of other diseases of the circulatory system: Z86.79

## 2010-09-12 LAB — CBC
HCT: 34.6 % — ABNORMAL LOW (ref 36.0–46.0)
Hemoglobin: 11.6 g/dL — ABNORMAL LOW (ref 12.0–15.0)
MCH: 30 pg (ref 26.0–34.0)
MCHC: 33.5 g/dL (ref 30.0–36.0)
RBC: 3.87 MIL/uL (ref 3.87–5.11)

## 2010-09-12 LAB — COMPREHENSIVE METABOLIC PANEL
ALT: 8 U/L (ref 0–35)
Alkaline Phosphatase: 152 U/L — ABNORMAL HIGH (ref 39–117)
BUN: 9 mg/dL (ref 6–23)
CO2: 22 mEq/L (ref 19–32)
Calcium: 9.7 mg/dL (ref 8.4–10.5)
Glucose, Bld: 91 mg/dL (ref 70–99)
Potassium: 3.7 mEq/L (ref 3.5–5.1)
Sodium: 134 mEq/L — ABNORMAL LOW (ref 135–145)
Total Protein: 7.3 g/dL (ref 6.0–8.3)

## 2010-09-12 LAB — URINALYSIS, ROUTINE W REFLEX MICROSCOPIC
Bilirubin Urine: NEGATIVE
Glucose, UA: NEGATIVE mg/dL
Ketones, ur: NEGATIVE mg/dL
Specific Gravity, Urine: 1.025 (ref 1.005–1.030)
pH: 6.5 (ref 5.0–8.0)

## 2010-09-12 NOTE — Progress Notes (Signed)
Pt states, " I have been having pain in my right abd off and on and sharp especially when I touch the spot.On Thursday I also started the sensation of having diarrhea but passing  oily, mucous., Sometimes even without stool. I had a gasey feeling in my low abd for two weeks. It felt sharp and doubled me over. They did not feel like contractions."

## 2010-09-12 NOTE — ED Provider Notes (Signed)
Chief Complaint:  Abdominal Pain   Rachel Parsons is  30 y.o. [id number] at [redacted]w[redacted]d presents complaining of Abdominal Pain pt states she has been having "fatty stools" with some mucous since thurs.  But for the past two weeks have had sharp intermittent lower abdominal pain along with acute onset of sharp right sided pain worse with touching around 1200 lasting one hour.  Self resolved.  No f/c/n/v +decreased appetite, denies any pain currently.  Denies any urinary symptoms ?poor bladder emptying. She states irregular, every ? minutes associated with none and vaginal bleeding. , intact, along with active , denies any HA, vision changes, or RUQ pain that is persistent.   Obstetrical/Gynecological History: OB History    Grav Para Term Preterm Abortions TAB SAB Ect Mult Living   8 5 5  2 1 1   5       Past Medical History: Past Medical History  Diagnosis Date  . Palpitation   . DVT (deep venous thrombosis)     after knee surgery  . Atrial fibrillation   . History of atrial fibrillation     postural orthostatic tachycardia syndrome    Past Surgical History: Past Surgical History  Procedure Date  . Childbirth     times 5  . Tonsillectomy   . Knee arthroplasty   . Knee arthrocentesis     Family History: No family history on file.  Social History: History  Substance Use Topics  . Smoking status: Never Smoker   . Smokeless tobacco: Never Used  . Alcohol Use: No    Allergies:  Allergies  Allergen Reactions  . Erythromycin Other (See Comments)    Pt. States that this is a childhood allergy, she is unaware of there reaction it causes.  . Sulfa Antibiotics Other (See Comments)    Childhood allergy. Pt states she is unaware of reaction     Prescriptions prior to admission  Medication Sig Dispense Refill  . acetaminophen (TYLENOL) 325 MG tablet Take 650 mg by mouth every 6 (six) hours as needed. Back pain      . HYDROcodone-acetaminophen (VICODIN) 5-500 MG per tablet Take 0.5  tablets by mouth every 4 (four) hours as needed.        . metoprolol tartrate (LOPRESSOR) 25 MG tablet Take 1 tablet (25 mg total) by mouth 2 (two) times daily.  60 tablet  1  . Prenatal Vit-Iron Carbonyl-FA (PRENATABS RX PO) Take 1 tablet by mouth daily.        . metoprolol succinate (TOPROL-XL) 25 MG 24 hr tablet Take 1 tablet (25 mg total) by mouth daily.  14 tablet  0  . nadolol (CORGARD) 20 MG tablet Take 1 tablet (20 mg total) by mouth daily.  14 tablet  0    Review of Systems - Negative except per HPI  Physical Exam   Blood pressure 115/67, pulse 92, temperature 99 F (37.2 C), temperature source Oral, resp. rate 18, height 5' 2.5" (1.588 m), weight 76.374 kg (168 lb 6 oz), SpO2 98.00%.  General: General appearance - alert, well appearing, and in no distress Chest - clear to auscultation, no wheezes, rales or rhonchi, symmetric air entry Heart - normal rate, regular rhythm, normal S1, S2, no murmurs, rubs, clicks or gallops Abdomen - soft, nontender, nondistended, no masses or organomegaly Gravid, size cwd, vertex by leopolds, no CVATB Musculoskeletal - no joint tenderness, deformity or swelling Extremities - peripheral pulses normal, no pedal edema, no clubbing or cyanosis Baseline: 130-140 bpm, Variability:  Good {> 6 bpm), Accelerations: 15x15 and Decelerations: Absent irregular, every 2-7 minutes SVE by RN: 2/20/-3  Labs: Recent Results (from the past 24 hour(s))  URINALYSIS, ROUTINE W REFLEX MICROSCOPIC   Collection Time   09/12/10  6:13 PM      Component Value Range   Color, Urine YELLOW  YELLOW    Appearance CLEAR  CLEAR    Specific Gravity, Urine 1.025  1.005 - 1.030    pH 6.5  5.0 - 8.0    Glucose, UA NEGATIVE  NEGATIVE (mg/dL)   Hgb urine dipstick NEGATIVE  NEGATIVE    Bilirubin Urine NEGATIVE  NEGATIVE    Ketones, ur NEGATIVE  NEGATIVE (mg/dL)   Protein, ur NEGATIVE  NEGATIVE (mg/dL)   Urobilinogen, UA 0.2  0.0 - 1.0 (mg/dL)   Nitrite NEGATIVE  NEGATIVE     Leukocytes, UA NEGATIVE  NEGATIVE    Lab Results  Component Value Date   WBC 10.4 09/12/2010   HGB 11.6* 09/12/2010   HCT 34.6* 09/12/2010   MCV 89.4 09/12/2010   PLT 185 09/12/2010   Lab Results  Component Value Date   ALT 8 09/12/2010   AST 15 09/12/2010   ALKPHOS 152* 09/12/2010   BILITOT 0.4 09/12/2010   Lab Results  Component Value Date   NA 134* 09/12/2010   K 3.7 09/12/2010   CL 100 09/12/2010   CO2 22 09/12/2010    Imaging Studies:  No results found.   Assessment: Rachel Parsons is  30 y.o. Z6X0960 at [redacted]w[redacted]d presents with c/o fatty stools.  Plan: 1. Current lab evaluation is normal including CMP, CBC, and UA.  Anticipate d/c home   Masayuki Sakai,LACHELLE7/28/20127:05 PM

## 2010-09-18 ENCOUNTER — Telehealth: Payer: Self-pay | Admitting: Internal Medicine

## 2010-09-18 NOTE — Telephone Encounter (Signed)
LMTC

## 2010-09-18 NOTE — Telephone Encounter (Signed)
Pt called and has some questions.  37 wks pregancy and being seen for afib.  Has he gotten in touch with OB phys.  She had an episode last night that she needs to discuss.

## 2010-09-23 ENCOUNTER — Encounter (HOSPITAL_COMMUNITY): Payer: Medicaid Other

## 2010-09-23 NOTE — Telephone Encounter (Signed)
LMTC

## 2010-09-28 ENCOUNTER — Other Ambulatory Visit: Payer: Self-pay | Admitting: Obstetrics and Gynecology

## 2010-09-28 NOTE — H&P (Signed)
Rachel Parsons is an 30 y.o. female who is scheduled for direct admit for Induction of Labor on Thursday October 01, 2010 at 39.0 weeks due patient request and multiple medical concerns as follows:   1. History of Precipitous labor with last two pregancies, barely making it to [facility] for her last delivery(lives in [place]) , and   2. 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   3. Additionally this pregnancy was notable for an episode of  Paroxysmal atrial fibrillation at 25 weeks, with spontaneous conversion shortly before scheduled cardioversion.  See notes from hospitalization 5/14 to 5/16 to cardiology service(DR Graciela Husbands). Patient has been on Lopressor 25 mg bid since discharge, with NO atrial fibrillation, and only one episode of tachycardia into 115-120 range occurring early August and responding to one additional dose of Lopressor 25 mg. Finally    4. Patient had a distant history of a PostOP DVT after an arthroscopy, with Lovenox used x 6 months. Thrombophilia workup was negative with - ACA, -LAC, rest of w/u negative.  Patient had a subsequent pregnancy without complications and no anticoagulant thromboprophylaxis  during pregnancy or postpartum.   Pertinent Gynecological History: Menses:  Bleeding:  Contraception: desires postpartum BTL at 4-6 weeks after delivery and considration of repair of rectus diastasis/hernia. DES exposure: denies Blood transfusions: none Sexually transmitted diseases: no past history Previous GYN Procedures:   Last mammogram:  Date: n/a Last pap: normal Date: 2011 OB History: G8 , P5 0 2 5   Menstrual History: Menarche age:  No LMP recorded. Patient is pregnant.    Past Medical History  Diagnosis Date  . Palpitation   . DVT (deep venous thrombosis)     after knee surgery  . Atrial fibrillation   . History of atrial fibrillation     postural orthostatic tachycardia syndrome    Past Surgical History    Procedure Date  . Childbirth     times 5  . Tonsillectomy   . Knee arthroplasty   . Knee arthrocentesis     No family history on file.  Social History:  reports that she has never smoked. She has never used smokeless tobacco. She reports that she does not drink alcohol or use illicit drugs.  Allergies:  Allergies  Allergen Reactions  . Erythromycin Other (See Comments)    Pt. States that this is a childhood allergy, she is unaware of there reaction it causes.  . Sulfa Antibiotics Other (See Comments)    Childhood allergy. Pt states she is unaware of reaction      (Not in a hospital admission)  ROS see HPI regarding DVT, and heart atrial Fibrillation  There were no vitals taken for this visit. Physical ExamPhysical Examination: General appearance - alert, well appearing, and in no distress, oriented to person, place, and time and person Heart - normal rate and regular rhythm Physical Examination: Abdomen - soft, nontender, nondistended, no masses or organomegaly Gravid uterus c/w dates Pelvic - normal external genitalia, vulva, vagina, cervix, uterus and adnexa, CERVIX: 3cm,50%,-1 vertex, exam chaperoned by RN Extremities - peripheral pulses normal, no pedal edema, no clubbing or cyanosis, pedal edema 1 +  No results found for this or any previous visit (from the past 24 hour(s)).  @[id number] @  Assessment/Plan: Pregnancy 39.0 weeks, Induction of labor due to hx of precipitous labor, +GBS with this pregnancy, Advanced cervical favorability, hx atrial fibrillation controlled on lopressor 25 mg po bid.  Karo Rog V 09/28/2010, 2:21 PM

## 2010-09-30 NOTE — Telephone Encounter (Signed)
No response from the patient. I will close this encounter.

## 2010-10-01 ENCOUNTER — Encounter (HOSPITAL_COMMUNITY): Payer: Self-pay | Admitting: Anesthesiology

## 2010-10-01 ENCOUNTER — Inpatient Hospital Stay (HOSPITAL_COMMUNITY)
Admission: AD | Admit: 2010-10-01 | Discharge: 2010-10-03 | DRG: 775 | Disposition: A | Discharge status: Home / Self Care | Payer: Medicaid Other | Source: Ambulatory Visit | Attending: Obstetrics and Gynecology | Admitting: Obstetrics and Gynecology

## 2010-10-01 ENCOUNTER — Inpatient Hospital Stay (HOSPITAL_COMMUNITY): Payer: Medicaid Other | Admitting: Anesthesiology

## 2010-10-01 ENCOUNTER — Encounter (HOSPITAL_COMMUNITY): Payer: Self-pay | Admitting: *Deleted

## 2010-10-01 DIAGNOSIS — Z22330 Carrier of Group B streptococcus: Secondary | ICD-10-CM

## 2010-10-01 DIAGNOSIS — O99892 Other specified diseases and conditions complicating childbirth: Secondary | ICD-10-CM

## 2010-10-01 DIAGNOSIS — O9989 Other specified diseases and conditions complicating pregnancy, childbirth and the puerperium: Secondary | ICD-10-CM

## 2010-10-01 DIAGNOSIS — Z3400 Encounter for supervision of normal first pregnancy, unspecified trimester: Secondary | ICD-10-CM

## 2010-10-01 DIAGNOSIS — I4891 Unspecified atrial fibrillation: Secondary | ICD-10-CM

## 2010-10-01 DIAGNOSIS — R002 Palpitations: Secondary | ICD-10-CM

## 2010-10-01 HISTORY — DX: Reserved for concepts with insufficient information to code with codable children: IMO0002

## 2010-10-01 HISTORY — DX: Depression, unspecified: F32.A

## 2010-10-01 HISTORY — DX: Major depressive disorder, single episode, unspecified: F32.9

## 2010-10-01 HISTORY — DX: Unspecified abnormal cytological findings in specimens from cervix uteri: R87.619

## 2010-10-01 LAB — CBC
Hemoglobin: 12 g/dL (ref 12.0–15.0)
MCHC: 33.4 g/dL (ref 30.0–36.0)
RDW: 13.5 % (ref 11.5–15.5)
WBC: 9.2 10*3/uL (ref 4.0–10.5)

## 2010-10-01 LAB — RPR: RPR Ser Ql: NONREACTIVE

## 2010-10-01 MED ORDER — ONDANSETRON HCL 4 MG PO TABS: 4.0000 mg | ORAL_TABLET | ORAL | Status: DC | PRN

## 2010-10-01 MED ORDER — FENTANYL 2.5 MCG/ML BUPIVACAINE 1/10 % EPIDURAL INFUSION (WH - ANES)
14.0000 mL/h | INTRAMUSCULAR | Status: DC
  Administered 2010-10-01: 14 mL/h via EPIDURAL
  Filled 2010-10-01: qty 60

## 2010-10-01 MED ORDER — FERROUS SULFATE 325 (65 FE) MG PO TABS
325.0000 mg | ORAL_TABLET | Freq: Two times a day (BID) | ORAL | Status: DC
  Administered 2010-10-02 – 2010-10-03 (×3): 325 mg via ORAL
  Filled 2010-10-01 (×4): qty 1

## 2010-10-01 MED ORDER — ONDANSETRON HCL 4 MG/2ML IJ SOLN: 4.0000 mg | Freq: Four times a day (QID) | INTRAMUSCULAR | Status: DC | PRN

## 2010-10-01 MED ORDER — ACETAMINOPHEN 325 MG PO TABS: 650.0000 mg | ORAL_TABLET | ORAL | Status: DC | PRN

## 2010-10-01 MED ORDER — SENNOSIDES-DOCUSATE SODIUM 8.6-50 MG PO TABS
2.0000 | ORAL_TABLET | Freq: Every day | ORAL | Status: DC
  Administered 2010-10-01 – 2010-10-02 (×2): 2 via ORAL

## 2010-10-01 MED ORDER — OXYTOCIN 20 UNITS IN LACTATED RINGERS INFUSION - SIMPLE
1.0000 m[IU]/min | INTRAVENOUS | Status: DC
  Administered 2010-10-01: 1 m[IU]/min via INTRAVENOUS

## 2010-10-01 MED ORDER — METOPROLOL TARTRATE 25 MG PO TABS
25.0000 mg | ORAL_TABLET | Freq: Two times a day (BID) | ORAL | Status: DC
  Administered 2010-10-01 – 2010-10-02 (×3): 25 mg via ORAL
  Filled 2010-10-01 (×7): qty 1

## 2010-10-01 MED ORDER — BENZOCAINE-MENTHOL 20-0.5 % EX AERO: 1.0000 "application " | INHALATION_SPRAY | CUTANEOUS | Status: DC | PRN

## 2010-10-01 MED ORDER — OXYCODONE-ACETAMINOPHEN 5-325 MG PO TABS: 2.0000 | ORAL_TABLET | ORAL | Status: DC | PRN

## 2010-10-01 MED ORDER — CITRIC ACID-SODIUM CITRATE 334-500 MG/5ML PO SOLN: 30.0000 mL | ORAL | Status: DC | PRN

## 2010-10-01 MED ORDER — PRENATAL PLUS 27-1 MG PO TABS
1.0000 | ORAL_TABLET | Freq: Every day | ORAL | Status: DC
  Administered 2010-10-02 – 2010-10-03 (×2): 1 via ORAL
  Filled 2010-10-01 (×2): qty 1

## 2010-10-01 MED ORDER — DIBUCAINE 1 % RE OINT: 1.0000 "application " | TOPICAL_OINTMENT | RECTAL | Status: DC | PRN

## 2010-10-01 MED ORDER — TETANUS-DIPHTH-ACELL PERTUSSIS 5-2.5-18.5 LF-MCG/0.5 IM SUSP
0.5000 mL | Freq: Once | INTRAMUSCULAR | Status: AC
  Administered 2010-10-02: 0.5 mL via INTRAMUSCULAR
  Filled 2010-10-01: qty 0.5

## 2010-10-01 MED ORDER — NALBUPHINE SYRINGE 5 MG/0.5 ML
5.0000 mg | INJECTION | INTRAMUSCULAR | Status: DC | PRN
  Filled 2010-10-01: qty 0.5

## 2010-10-01 MED ORDER — LIDOCAINE HCL (PF) 1 % IJ SOLN
30.0000 mL | INTRAMUSCULAR | Status: DC | PRN
  Filled 2010-10-01: qty 30

## 2010-10-01 MED ORDER — LACTATED RINGERS IV SOLN
500.0000 mL | Freq: Once | INTRAVENOUS | Status: AC
  Administered 2010-10-01: 500 mL via INTRAVENOUS

## 2010-10-01 MED ORDER — LACTATED RINGERS IV SOLN
INTRAVENOUS | Status: DC
  Administered 2010-10-01 (×2): via INTRAVENOUS

## 2010-10-01 MED ORDER — WITCH HAZEL-GLYCERIN EX PADS: 1.0000 "application " | MEDICATED_PAD | CUTANEOUS | Status: DC | PRN

## 2010-10-01 MED ORDER — SIMETHICONE 80 MG PO CHEW: 80.0000 mg | CHEWABLE_TABLET | ORAL | Status: DC | PRN

## 2010-10-01 MED ORDER — OXYCODONE-ACETAMINOPHEN 5-325 MG PO TABS
1.0000 | ORAL_TABLET | ORAL | Status: DC | PRN
  Administered 2010-10-02: 2 via ORAL
  Administered 2010-10-02 – 2010-10-03 (×4): 1 via ORAL
  Filled 2010-10-01 (×2): qty 1
  Filled 2010-10-01: qty 2
  Filled 2010-10-01 (×2): qty 1

## 2010-10-01 MED ORDER — TERBUTALINE SULFATE 1 MG/ML IJ SOLN: 0.2500 mg | Freq: Once | INTRAMUSCULAR | Status: DC | PRN

## 2010-10-01 MED ORDER — OXYTOCIN 20 UNITS IN LACTATED RINGERS INFUSION - SIMPLE
125.0000 mL/h | INTRAVENOUS | Status: DC
  Filled 2010-10-01: qty 1000

## 2010-10-01 MED ORDER — IBUPROFEN 600 MG PO TABS: 600.0000 mg | ORAL_TABLET | Freq: Four times a day (QID) | ORAL | Status: DC | PRN

## 2010-10-01 MED ORDER — LACTATED RINGERS IV SOLN: 500.0000 mL | INTRAVENOUS | Status: DC | PRN

## 2010-10-01 MED ORDER — LIDOCAINE HCL 1.5 % IJ SOLN
INTRAMUSCULAR | Status: DC | PRN
  Administered 2010-10-01 (×2): 4 mL via EPIDURAL

## 2010-10-01 MED ORDER — IBUPROFEN 600 MG PO TABS
600.0000 mg | ORAL_TABLET | Freq: Four times a day (QID) | ORAL | Status: DC
  Administered 2010-10-02 – 2010-10-03 (×7): 600 mg via ORAL
  Filled 2010-10-01 (×6): qty 1

## 2010-10-01 MED ORDER — EPHEDRINE 5 MG/ML INJ
10.0000 mg | INTRAVENOUS | Status: DC | PRN
  Filled 2010-10-01: qty 4

## 2010-10-01 MED ORDER — ONDANSETRON HCL 4 MG/2ML IJ SOLN: 4.0000 mg | INTRAMUSCULAR | Status: DC | PRN

## 2010-10-01 MED ORDER — FLEET ENEMA 7-19 GM/118ML RE ENEM: 1.0000 | ENEMA | RECTAL | Status: DC | PRN

## 2010-10-01 MED ORDER — PENICILLIN G POTASSIUM 5000000 UNITS IJ SOLR
5.0000 10*6.[IU] | Freq: Once | INTRAVENOUS | Status: AC
  Administered 2010-10-01: 5 10*6.[IU] via INTRAVENOUS
  Filled 2010-10-01: qty 5

## 2010-10-01 MED ORDER — EPHEDRINE 5 MG/ML INJ
10.0000 mg | INTRAVENOUS | Status: DC | PRN
  Filled 2010-10-01 (×2): qty 4

## 2010-10-01 MED ORDER — PENICILLIN G POTASSIUM 5000000 UNITS IJ SOLR
2.5000 10*6.[IU] | INTRAVENOUS | Status: DC
  Administered 2010-10-01 (×2): 2.5 10*6.[IU] via INTRAVENOUS
  Filled 2010-10-01 (×5): qty 2.5

## 2010-10-01 MED ORDER — PHENYLEPHRINE 40 MCG/ML (10ML) SYRINGE FOR IV PUSH (FOR BLOOD PRESSURE SUPPORT)
80.0000 ug | PREFILLED_SYRINGE | INTRAVENOUS | Status: DC | PRN
  Filled 2010-10-01 (×2): qty 5

## 2010-10-01 MED ORDER — PHENYLEPHRINE 40 MCG/ML (10ML) SYRINGE FOR IV PUSH (FOR BLOOD PRESSURE SUPPORT)
80.0000 ug | PREFILLED_SYRINGE | INTRAVENOUS | Status: DC | PRN
  Filled 2010-10-01: qty 5

## 2010-10-01 MED ORDER — LANOLIN HYDROUS EX OINT: TOPICAL_OINTMENT | CUTANEOUS | Status: DC | PRN

## 2010-10-01 MED ORDER — OXYTOCIN BOLUS FROM INFUSION
500.0000 mL | Freq: Once | INTRAVENOUS | Status: DC
  Filled 2010-10-01: qty 500

## 2010-10-01 MED ORDER — DIPHENHYDRAMINE HCL 25 MG PO CAPS: 25.0000 mg | ORAL_CAPSULE | Freq: Four times a day (QID) | ORAL | Status: DC | PRN

## 2010-10-01 MED ORDER — DIPHENHYDRAMINE HCL 50 MG/ML IJ SOLN: 12.5000 mg | INTRAMUSCULAR | Status: DC | PRN

## 2010-10-01 NOTE — Anesthesia Preprocedure Evaluation (Signed)
Anesthesia Evaluation  Name, MR# and DOB Patient awake  General Assessment Comment  Reviewed: Allergy & Precautions, H&P , NPO status   Airway Mallampati: III TM Distance: >3 FB Neck ROM: Full    Dental No notable dental hx. (+) Poor Dentition   Pulmonary    pulmonary exam normalPulmonary Exam Normal     Cardiovascular Regular Normal    Neuro/Psych Negative Neurological ROS    GI/Hepatic/Renal negative GI ROS, negative Liver ROS, and negative Renal ROS (+)       Endo/Other  Negative Endocrine ROS (+)      Abdominal Normal abdominal exam  (+)   Musculoskeletal negative musculoskeletal ROS (+)   Hematology negative hematology ROS (+)   Peds  Reproductive/Obstetrics (+) Pregnancy    Anesthesia Other Findings             Anesthesia Physical Anesthesia Plan  ASA: II  Anesthesia Plan: Epidural   Post-op Pain Management:    Induction:   Airway Management Planned:   Additional Equipment:   Intra-op Plan:   Post-operative Plan:   Informed Consent: I have reviewed the patients History and Physical, chart, labs and discussed the procedure including the risks, benefits and alternatives for the proposed anesthesia with the patient or authorized representative who has indicated his/her understanding and acceptance.     Plan Discussed with: Anesthesiologist  Anesthesia Plan Comments:         Anesthesia Quick Evaluation

## 2010-10-01 NOTE — Anesthesia Procedure Notes (Addendum)
Epidural Patient location during procedure: OB Start time: 10/01/2010 4:43 PM  Staffing Anesthesiologist: Romy Mcgue A. Performed by: anesthesiologist   Preanesthetic Checklist Completed: patient identified, site marked, surgical consent, pre-op evaluation, timeout performed, IV checked, risks and benefits discussed and monitors and equipment checked  Epidural Patient position: sitting Prep: site prepped and draped and DuraPrep Patient monitoring: continuous pulse ox and blood pressure Approach: midline Injection technique: LOR air  Needle:  Needle type: Tuohy  Needle gauge: 17 G Needle length: 9 cm Needle insertion depth: 5 cm cm Catheter type: closed end flexible Catheter size: 19 Gauge Catheter at skin depth: 10 cm Test dose: negative and 1.5% lidocaine  Assessment Events: blood not aspirated, injection not painful, no injection resistance, negative IV test and no paresthesia  Additional Notes Patient is more comfortable after epidural dosed. Please see RN's note for documentation of vital signs and FHR which are stable.

## 2010-10-01 NOTE — Progress Notes (Signed)
Rachel Parsons is a 30 y.o. [id number] at [redacted]w[redacted]d admitted for induction of labor due to Elective at term.  Subjective: Comfortable.  Feeling some contractions.  Currently on the birthing ball.  Objective: BP 111/60  Pulse 87  Temp(Src) 97.8 F (36.6 C) (Oral)  Resp 18  Ht 5\' 3"  (1.6 m)  Wt 76.658 kg (169 lb)  BMI 29.94 kg/m2      FHT:  FHR: 125-130 bpm, variability: moderate,  accelerations:  Present,  decelerations:  Absent UC:   regular, every 2-5 minutes SVE:  Deferred  Labs: Lab Results  Component Value Date   WBC 9.2 10/01/2010   HGB 12.0 10/01/2010   HCT 35.9* 10/01/2010   MCV 89.5 10/01/2010   PLT 214 10/01/2010    Assessment / Plan: Induction of labor due to elective at term,  progressing well on pitocin, pt desires natural child birth.  Will continue current management and support.  Labor: Progressing normally Fetal Wellbeing:  Category I Pain Control:  Labor support without medications I/D:  PCN for GBS ppx. awaiting amniotomy for abx coverage.  2nd dose currently infusing.  Anticipated MOD:  NSVD  Rachel Parsons 10/01/2010, 1:54 PM

## 2010-10-01 NOTE — Progress Notes (Signed)
Rachel Parsons is a 30 y.o. [id number] at [redacted]w[redacted]d admitted for induction of labor due to Elective at term.  Subjective: Comfortable.  Feeling some contractions.  Currently on the birthing ball.  Objective: BP 107/69  Pulse 91  Temp(Src) 98.4 F (36.9 C) (Oral)  Resp 18  Ht 5\' 3"  (1.6 m)  Wt 76.658 kg (169 lb)  BMI 29.94 kg/m2  FHT:  FHR: 145 bpm, variability: moderate,  accelerations:  Present,  decelerations:  Absent UC:   regular, every 2-5 minutes SVE:  5-6/60/-1 intact, vertex by bedside ultrasound Pitocin at 14/min  Labs: Lab Results  Component Value Date   WBC 9.2 10/01/2010   HGB 12.0 10/01/2010   HCT 35.9* 10/01/2010   MCV 89.5 10/01/2010   PLT 214 10/01/2010    Assessment / Plan: Induction of labor due to elective at term,  progressing well on pitocin, pt desires natural child birth.  Will continue current management and support.  Labor: Progressing normally, continue pitocin titration Fetal Wellbeing:  Category I Pain Control:  Labor support without medications I/D:  PCN for GBS ppx.  Anticipated MOD:  NSVD  Rachel Parsons 10/01/2010, 3:50 PM

## 2010-10-01 NOTE — Progress Notes (Signed)
-  SVE: 10/100/+1-FHTs reassuring.  Will start pushing.    Rachel Parsons  6:28 PM 10/01/2010

## 2010-10-01 NOTE — Progress Notes (Signed)
Rachel Parsons is a 30 y.o. [id number] at [redacted]w[redacted]d admitted for induction of labor due to Elective at term.  Subjective: Comfortable with epidural.   Objective: BP 115/62  Pulse 91  Temp(Src) 98.4 F (36.9 C) (Oral)  Resp 18  Ht 5\' 3"  (1.6 m)  Wt 76.658 kg (169 lb)  BMI 29.94 kg/m2  SpO2 95%     FHT:  FHR: 125-130 bpm, variability: moderate,  accelerations:  Present,  decelerations:  Absent UC:   regular, every 2 minutes pitocin at 16/min SVE:   Dilation: 6.5 Effacement (%): 60 Station: -1 Exam by:: dr Orvan Falconer Arom clear Labs: Lab Results  Component Value Date   WBC 9.2 10/01/2010   HGB 12.0 10/01/2010   HCT 35.9* 10/01/2010   MCV 89.5 10/01/2010   PLT 214 10/01/2010    Assessment / Plan: Induction of labor due to elective at term,  progressing well on pitocin  Labor: Progressing normally Fetal Wellbeing:  Category I Pain Control:  Epidural I/D:  GBS ppx with PCN >4 hrs. Anticipated MOD:  NSVD  Roverto Bodmer 10/01/2010, 5:48 PM

## 2010-10-01 NOTE — H&P (Signed)
Margy Sumler Jobe is a 30 y.o. female [phone] with IUP at [redacted]w[redacted]d presenting for IOL for SVT/h/o precipitous delivery with GBS pos. Pt states she has been having irregular, every ? minutes, associated with none vaginal bleeding, intact, with active.  She desires to bilateral tubal ligation @ 6 weeks. PNCare at [facility] since 9.4 wks  Prenatal History/Complications: H/o SVT-treated with lopressor and short term lovenox H/o DVT s/p arthoscopy knee   Past Medical History: Past Medical History  Diagnosis Date  . Palpitation   . DVT (deep venous thrombosis)     after knee surgery  . Atrial fibrillation   . History of atrial fibrillation     postural orthostatic tachycardia syndrome  . Abnormal Pap smear   . Shortness of breath   . Depression     Past Surgical History: Past Surgical History  Procedure Date  . Childbirth     times 5  . Tonsillectomy   . Knee arthroplasty   . Knee arthrocentesis     Obstetrical History: OB History    Grav Para Term Preterm Abortions TAB SAB Ect Mult Living   8 5 5  2 1 1   5       Gynecological History: No STIs orHSV  Social History: History   Social History  . Marital Status: Married    Spouse Name: N/A    Number of Children: N/A  . Years of Education: N/A   Social History Main Topics  . Smoking status: Never Smoker   . Smokeless tobacco: Never Used  . Alcohol Use: No  . Drug Use: No  . Sexually Active: Yes   Other Topics Concern  . None   Social History Narrative  . None    Family History: Family History  Problem Relation Age of Onset  . Cancer Maternal Grandmother   . Cancer Maternal Grandfather     Allergies: Allergies  Allergen Reactions  . Erythromycin Other (See Comments)    Pt. States that this is a childhood allergy, she is unaware of there reaction it causes.  . Sulfa Antibiotics Other (See Comments)    Childhood allergy. Pt states she is unaware of reaction     Prescriptions prior to admission  Medication Sig  Dispense Refill  . acetaminophen (TYLENOL) 325 MG tablet Take 650 mg by mouth every 6 (six) hours as needed. Back pain      . HYDROcodone-acetaminophen (VICODIN) 5-500 MG per tablet Take 0.5 tablets by mouth every 4 (four) hours as needed.        . metoprolol tartrate (LOPRESSOR) 25 MG tablet Take 1 tablet (25 mg total) by mouth 2 (two) times daily.  60 tablet  1  . Prenatal Vit-Iron Carbonyl-FA (PRENATABS RX PO) Take 1 tablet by mouth daily.          Review of Systems - Negative except per HPI   Blood pressure 117/71, pulse 83, temperature 98.2 F (36.8 C), temperature source Oral, resp. rate 20, height 5\' 3"  (1.6 m), weight 76.658 kg (169 lb). General appearance: alert and cooperative Lungs: clear to auscultation bilaterally Heart: regular rate and rhythm, S1, S2 normal, no murmur, click, rub or gallop Abdomen: soft, non-tender; bowel sounds normal; no masses,  no organomegaly and gravid, size cwd, EFW 6.8-7lbs by leopolds, vertex by leopolds Extremities: Homans sign is negative, no sign of DVT and trace pitting edema bilaterally, no calf tenderness or redness cephalic Baseline: 135 bpm, Variability: Good {> 6 bpm), Accelerations: Reactive and Decelerations: Absent Irregular Cx:  4/50/-2     Prenatal labs: ABO, Rh:O pos   Antibody: neg  Rubella:Immune   RPR:NR    HBsAg:NR    HIV:NR    GBS:pos    2 hr Glucola-74/91/74 Genetic screening normal Anatomy US -choriod plexus cyst resolved   Assessment: Raschelle Wisenbaker Brege is a 30 y.o. [id number] with an IUP at [redacted]w[redacted]d presenting for IOL for h/o SVT currently stable, h/o precipitous deliveries with GBS pos status.  Plan: 1. Admit to labor and delivery for IOL 2. Pitocin for induction 3. PCN for GBS ppx 4. Lopressor given this am will reeval this pm.    Aum Caggiano 10/01/2010, 8:53 AM

## 2010-10-02 NOTE — Anesthesia Postprocedure Evaluation (Signed)
  Anesthesia Post-op Note  Patient: Rachel Parsons  Procedure(s) Performed: * No procedures listed *  Patient Location: PACU and Mother/Baby  Anesthesia Type: Epidural  Level of Consciousness: awake, alert  and oriented  Airway and Oxygen Therapy: Patient Spontanous Breathing  Post-op Pain: mild  Post-op Assessment: Patient's Cardiovascular Status Stable  Post-op Vital Signs: stable  Complications: No apparent anesthesia complications

## 2010-10-02 NOTE — Progress Notes (Signed)
Post Partum Day 1 Subjective: up ad lib, voiding, + flatus and continues to have abdominal cramping and "contractions," have improved some overnight, thick, heavy lochia and no clots, more than is "normal" for her, absent BM, present flatus, plans to breastfeed, outpatient BTL  Objective: Blood pressure 108/64, pulse 76, temperature 98.3 F (36.8 C), temperature source Oral, resp. rate 20, height 5\' 3"  (1.6 m), weight 169 lb (76.658 kg), SpO2 98.00%, unknown if currently breastfeeding.  Physical Exam:  General: alert, cooperative and no distress Lochia: appropriate Chest: CTAB Heart: RRR no m/r/g Abdomen: +BS, soft, nontender,  Uterine Fundus: firm DVT Evaluation: No evidence of DVT seen on physical exam. Negative Homan's sign. No significant calf/ankle edema. Extremities:    Basename 10/01/10 0800  HGB 12.0  HCT 35.9*    Assessment/Plan: Plan for discharge tomorrow and Contraception will have outpatient BTL   LOS: 1 day   BOOTH, ERIN 10/02/2010, 7:54 AM

## 2010-10-02 NOTE — Progress Notes (Signed)
UR Chart review completed.  

## 2010-10-03 MED ORDER — IBUPROFEN 600 MG PO TABS: 600.0000 mg | ORAL_TABLET | Freq: Four times a day (QID) | ORAL | Status: AC

## 2010-10-03 NOTE — Progress Notes (Signed)
Post Partum Day 2 Subjective: no complaints, up ad lib, voiding, tolerating PO and + flatus  Objective: Blood pressure 107/63, pulse 79, temperature 98 F (36.7 C), temperature source Oral, resp. rate 18, height 5\' 3"  (1.6 m), weight 169 lb (76.658 kg), SpO2 98.00%, unknown if currently breastfeeding.  Physical Exam:  General: alert, cooperative and no distress Lochia: appropriate Uterine Fundus: firm DVT Evaluation: No evidence of DVT seen on physical exam. Negative Homan's sign.   Basename 10/01/10 0800  HGB 12.0  HCT 35.9*    Assessment/Plan: Discharge home, Breastfeeding and Contraception outpatient BTL   LOS: 2 days   Lindaann Slough. MD 10/03/2010, 5:54 AM

## 2010-10-03 NOTE — Discharge Summary (Signed)
Obstetric Discharge Summary Reason for Admission: onset of labor Prenatal Procedures: ultrasound Intrapartum Procedures: spontaneous vaginal delivery Postpartum Procedures: none Complications-Operative and Postpartum: none Hemoglobin  Date Value Range Status  10/01/2010 12.0  12.0-15.0 (g/dL) Final     HCT  Date Value Range Status  10/01/2010 35.9* 36.0-46.0 (%) Final    Discharge Diagnoses: Term Pregnancy-delivered  Discharge Information: Date: 10/03/2010 Activity: pelvic rest Diet: routine Medications: Ibuprophen Condition: stable Instructions: refer to practice specific booklet Discharge to: home Follow-up Information    Follow up with [facility] in 6 weeks. (post partum visit)    Contact information:   [address] [place] [address]          Newborn Data: Live born female  Birth Weight: 9 lb 1 oz (4111 g) APGAR: 7, 9  Home with mother.  Lindaann Slough MD 10/03/2010, 5:58 AM

## 2010-10-11 NOTE — Discharge Summary (Signed)
Agree with above note.  Rachel Parsons 10/11/2010 6:15 AM   

## 2010-11-10 ENCOUNTER — Ambulatory Visit (INDEPENDENT_AMBULATORY_CARE_PROVIDER_SITE_OTHER): Payer: Medicaid Other | Admitting: Internal Medicine

## 2010-11-10 ENCOUNTER — Encounter: Payer: Self-pay | Admitting: Internal Medicine

## 2010-11-10 DIAGNOSIS — R002 Palpitations: Secondary | ICD-10-CM

## 2010-11-10 DIAGNOSIS — I4891 Unspecified atrial fibrillation: Secondary | ICD-10-CM

## 2010-11-10 NOTE — Assessment & Plan Note (Signed)
No recurrent atrial fibrillation. I suspect that this may recur. The issue of genetic predisposition will need to be considered

## 2010-11-10 NOTE — Assessment & Plan Note (Signed)
This appeared to be a manifestation of pots potentially aggravated by uterine obstruction during her third trimester. The symptoms have now largely resolved

## 2010-11-10 NOTE — Progress Notes (Signed)
  HPI  Rachel Parsons is a 30 y.o. female Seen in followup for atrial fibrillation and POTS   These were first identifeid during pregnancy at her tachypalpitations largely resolved following the delivery of her child, Rachel Parsons. This is consistent with the hypothesis of uterine obstruction and autonomic tachycardia  Past Medical History  Diagnosis Date  . Palpitation   . DVT (deep venous thrombosis)     after knee surgery  . Atrial fibrillation   . History of atrial fibrillation     postural orthostatic tachycardia syndrome  . Abnormal Pap smear   . Shortness of breath   . Depression     Past Surgical History  Procedure Date  . Childbirth     times 5  . Tonsillectomy   . Knee arthroplasty   . Knee arthrocentesis     Current Outpatient Prescriptions  Medication Sig Dispense Refill  . Prenatal Vit-Iron Carbonyl-FA (PRENATABS RX PO) Take 1 tablet by mouth daily.        . calcium carbonate (TUMS EX) 750 MG chewable tablet Chew 1 tablet by mouth daily.        Marland Kitchen HYDROcodone-acetaminophen (VICODIN) 5-500 MG per tablet Take 0.5 tablets by mouth every 4 (four) hours as needed.        . metoprolol tartrate (LOPRESSOR) 25 MG tablet Take 1 tablet (25 mg total) by mouth 2 (two) times daily.  60 tablet  1    Allergies  Allergen Reactions  . Erythromycin Other (See Comments)    Pt. States that this is a childhood allergy, she is unaware of there reaction it causes.  . Sulfa Antibiotics Other (See Comments)    Childhood allergy. Pt states she is unaware of reaction     Review of Systems negative except from HPI and PMH  Physical Exam Well developed and well nourished postpartum woman in no acute distress HENT normal E scleral and icterus clear Neck Supple JVP flat; carotids brisk and full Clear to ausculation Regular rate and rhythm, no murmurs gallops or rub Soft with active bowel sounds No clubbing cyanosis and edema Alert and oriented, grossly normal motor and sensory  function Skin Warm and Dry   Inus rhythm at 55 Intervals 0.15/2009/0.43 ECG   Assessment and  Plan

## 2010-11-10 NOTE — Patient Instructions (Signed)
Your physician wants you to follow-up in: 1 year  You will receive a reminder letter in the mail two months in advance. If you don't receive a letter, please call our office to schedule the follow-up appointment.  Your physician recommends that you continue on your current medications as directed. Please refer to the Current Medication list given to you today.  

## 2010-11-13 ENCOUNTER — Other Ambulatory Visit: Payer: Self-pay | Admitting: Obstetrics & Gynecology

## 2010-11-13 ENCOUNTER — Encounter (HOSPITAL_COMMUNITY)
Admission: RE | Admit: 2010-11-13 | Discharge: 2010-11-13 | Disposition: A | Discharge status: Home / Self Care | Payer: Medicaid Other | Source: Ambulatory Visit | Attending: Obstetrics & Gynecology | Admitting: Obstetrics & Gynecology

## 2010-11-13 ENCOUNTER — Encounter (HOSPITAL_COMMUNITY): Payer: Self-pay

## 2010-11-13 LAB — COMPREHENSIVE METABOLIC PANEL
Albumin: 3.9 g/dL (ref 3.5–5.2)
BUN: 11 mg/dL (ref 6–23)
Creatinine, Ser: 0.79 mg/dL (ref 0.50–1.10)
GFR calc Af Amer: >60 mL/min (ref >60)
Total Protein: 6.4 g/dL (ref 6.0–8.3)

## 2010-11-13 LAB — CBC
HCT: 41.6 % (ref 36.0–46.0)
MCH: 29.4 pg (ref 26.0–34.0)
MCHC: 32.9 g/dL (ref 30.0–36.0)
MCV: 89.3 fL (ref 78.0–100.0)
RDW: 13.4 % (ref 11.5–15.5)

## 2010-11-13 LAB — SURGICAL PCR SCREEN: MRSA, PCR: NEGATIVE

## 2010-11-13 LAB — HCG, QUANTITATIVE, PREGNANCY: hCG, Beta Chain, Quant, S: 1 m[IU]/mL (ref <5)

## 2010-11-13 MED ORDER — KETOROLAC TROMETHAMINE 30 MG/ML IJ SOLN: 30.0000 mg | Freq: Four times a day (QID) | INTRAMUSCULAR | Status: DC | PRN

## 2010-11-13 NOTE — Patient Instructions (Signed)
/  20 Rachel Parsons  11/13/2010   Your procedure is scheduled on:  11/18/10  Report to [facility] at 0730 AM.  Call this number if you have problems the morning of surgery: [phone]   Remember:   Do not eat food:After Midnight.  Do not drink clear liquids: After Midnight.  Take these medicines the morning of surgery with A SIP OF WATER: none   Do not wear jewelry, make-up or nail polish.  Do not wear lotions, powders, or perfumes. You may wear deodorant.  Do not shave 48 hours prior to surgery.  Do not bring valuables to the hospital.  Contacts, dentures or bridgework may not be worn into surgery.  Leave suitcase in the car. After surgery it may be brought to your room.  For patients admitted to the hospital, checkout time is 11:00 AM the day of discharge.   Patients discharged the day of surgery will not be allowed to drive home.  Name and phone number of your driver: husband  Special Instructions: CHG Shower Use Special Wash: 1/2 bottle night before surgery and 1/2 bottle morning of surgery.   Please read over the following fact sheets that you were given: Pain Booklet, MRSA Information, Surgical Site Infection Prevention, Anesthesia Post-op Instructions and Care and Recovery After Surgery   PATIENT INSTRUCTIONS POST-ANESTHESIA  IMMEDIATELY FOLLOWING SURGERY:  Do not drive or operate machinery for the first twenty four hours after surgery.  Do not make any important decisions for twenty four hours after surgery or while taking narcotic pain medications or sedatives.  If you develop intractable nausea and vomiting or a severe headache please notify your doctor immediately.  FOLLOW-UP:  Please make an appointment with your surgeon as instructed. You do not need to follow up with anesthesia unless specifically instructed to do so.  WOUND CARE INSTRUCTIONS (if applicable):  Keep a dry clean dressing on the anesthesia/puncture wound site if there is drainage.  Once the wound has quit  draining you may leave it open to air.  Generally you should leave the bandage intact for twenty four hours unless there is drainage.  If the epidural site drains for more than 36-48 hours please call the anesthesia department.  QUESTIONS?:  Please feel free to call your physician or the hospital operator if you have any questions, and they will be happy to assist you.     [facility] Anesthesia Department [address] [place] [phone]

## 2010-11-17 NOTE — H&P (Signed)
Rachel Parsons is an 30 y.o. female. G 8 P 6 A 2 about 7 weeks post partum who desires permanent sterilization.  She also has a small umbilical hernia which we will repair.     Past Medical History  Diagnosis Date  . Palpitation   . DVT (deep venous thrombosis)     after knee surgery  . Atrial fibrillation   . History of atrial fibrillation     postural orthostatic tachycardia syndrome  . Abnormal Pap smear     Past Surgical History  Procedure Date  . Childbirth     times 5  . Tonsillectomy   . Knee arthrocentesis   . Knee arthroplasty     right  . Cardioversion May 2012    pt. states heart spontaneously converted before cardioversion    Family History  Problem Relation Age of Onset  . Cancer Maternal Grandmother   . Cancer Maternal Grandfather   . Anesthesia problems Neg Hx   . Hypotension Neg Hx   . Malignant hyperthermia Neg Hx   . Pseudochol deficiency Neg Hx     Social History:  reports that she has never smoked. She has never used smokeless tobacco. She reports that she does not drink alcohol or use illicit drugs.  Allergies:  Allergies  Allergen Reactions  . Erythromycin Other (See Comments)    Pt. States that this is a childhood allergy, she is unaware of there reaction it causes.  . Sulfa Antibiotics Other (See Comments)    Childhood allergy. Pt states she is unaware of reaction       ROS  Review of Systems  Constitutional: Negative for fever, chills, weight loss, malaise/fatigue and diaphoresis.  HENT: Negative for hearing loss, ear pain, nosebleeds, congestion, sore throat, neck pain, tinnitus and ear discharge.   Eyes: Negative for blurred vision, double vision, photophobia, pain, discharge and redness.  Respiratory: Negative for cough, hemoptysis, sputum production, shortness of breath, wheezing and stridor.   Cardiovascular: Negative for chest pain, palpitations, orthopnea, claudication, leg swelling and PND.  Gastrointestinal: negative for  abdominal pain, but positive for bulge sometimes painful just above umbilicus.. Negative for heartburn, nausea, vomiting, diarrhea, constipation, blood in stool and melena.  Genitourinary: Negative for dysuria, urgency, frequency, hematuria and flank pain.  Musculoskeletal: Negative for myalgias, back pain, joint pain and falls.  Skin: Negative for itching and rash.  Neurological: Negative for dizziness, tingling, tremors, sensory change, speech change, focal weakness, seizures, loss of consciousness, weakness and headaches.  Endo/Heme/Allergies: Negative for environmental allergies and polydipsia. Does not bruise/bleed easily.  Psychiatric/Behavioral: Negative for depression, suicidal ideas, hallucinations, memory loss and substance abuse. The patient is not nervous/anxious and does not have insomnia.     currently breastfeeding. Physical Exam Physical Exam  Vitals reviewed. Constitutional: She is oriented to person, place, and time. She appears well-developed and well-nourished.  HENT:  Head: Normocephalic and atraumatic.  Right Ear: External ear normal.  Left Ear: External ear normal.  Nose: Nose normal.  Mouth/Throat: Oropharynx is clear and moist.  Eyes: Conjunctivae and EOM are normal. Pupils are equal, round, and reactive to light. Right eye exhibits no discharge. Left eye exhibits no discharge. No scleral icterus.  Neck: Normal range of motion. Neck supple. No tracheal deviation present. No thyromegaly present.  Cardiovascular: Normal rate, regular rhythm, normal heart sounds and intact distal pulses.  Exam reveals no gallop and no friction rub.   No murmur heard. Respiratory: Effort normal and breath sounds normal. No respiratory distress.  She has no wheezes. She has no rales. She exhibits no tenderness.  GI: Soft. Bowel sounds are normal. She exhibits no distension and no mass. There is tenderness. There is no rebound and no guarding.  Genitourinary:       Vulva is normal without  lesions Vagina is pink moist without discharge Cervix normal in appearance and pap is normal Uterus is norma sized and nontender, normal post partum involution. Adnexa is negative with normal sized ovaries by sonogram  Musculoskeletal: Normal range of motion. She exhibits no edema and no tenderness.  Neurological: She is alert and oriented to person, place, and time. She has normal reflexes. She displays normal reflexes. No cranial nerve deficit. She exhibits normal muscle tone. Coordination normal.  Skin: Skin is warm and dry. No rash noted. No erythema. No pallor.  Psychiatric: She has a normal mood and affect. Her behavior is normal. Judgment and thought content normal.    Recent Results (from the past 336 hour(s))  SURGICAL PCR SCREEN   Collection Time   11/13/10  2:17 PM      Component Value Range   MRSA, PCR NEGATIVE  NEGATIVE    Staphylococcus aureus NEGATIVE  NEGATIVE   HCG, QUANTITATIVE, PREGNANCY   Collection Time   11/13/10  2:30 PM      Component Value Range   hCG, Beta Chain, Quant, S 1  <5 (mIU/mL)  CBC   Collection Time   11/13/10  3:25 PM      Component Value Range   WBC 7.4  4.0 - 10.5 (K/uL)   RBC 4.66  3.87 - 5.11 (MIL/uL)   Hemoglobin 13.7  12.0 - 15.0 (g/dL)   HCT 16.1  09.6 - 04.5 (%)   MCV 89.3  78.0 - 100.0 (fL)   MCH 29.4  26.0 - 34.0 (pg)   MCHC 32.9  30.0 - 36.0 (g/dL)   RDW 40.9  81.1 - 91.4 (%)   Platelets 244  150 - 400 (K/uL)  COMPREHENSIVE METABOLIC PANEL   Collection Time   11/13/10  3:25 PM      Component Value Range   Sodium 143  135 - 145 (mEq/L)   Potassium 4.0  3.5 - 5.1 (mEq/L)   Chloride 107  96 - 112 (mEq/L)   CO2 27  19 - 32 (mEq/L)   Glucose, Bld 87  70 - 99 (mg/dL)   BUN 11  6 - 23 (mg/dL)   Creatinine, Ser 7.82  0.50 - 1.10 (mg/dL)   Calcium 9.2  8.4 - 95.6 (mg/dL)   Total Protein 6.4  6.0 - 8.3 (g/dL)   Albumin 3.9  3.5 - 5.2 (g/dL)   AST 13  0 - 37 (U/L)   ALT 10  0 - 35 (U/L)   Alkaline Phosphatase 85  39 - 117 (U/L)    Total Bilirubin 0.5  0.3 - 1.2 (mg/dL)   GFR calc non Af Amer >60  >60 (mL/min)   GFR calc Af Amer >60  >60 (mL/min)       Assessment/Plan: 1.  Multiparous female desires permanent sterilization  2.  Umbilical hernia  Patient is admitted for outpatient laparoscopic BTL using electrocautery and umbilical hernia repair.  Pt understands the risks of surgery including but not limited t  excessive bleeding requiring transfusion or reoperation, post-operative infection requiring prolonged hospitalization or re-hospitalization and antibiotic therapy, and damage to other organs including bladder, bowel, ureters and major vessels.  The patient also understands the alternative treatment options which  were discussed in full.  All questions were answered.  Naoki Migliaccio H 11/17/2010, 9:47 PM

## 2010-11-18 ENCOUNTER — Ambulatory Visit (HOSPITAL_COMMUNITY): Payer: Medicaid Other | Admitting: Anesthesiology

## 2010-11-18 ENCOUNTER — Other Ambulatory Visit: Payer: Self-pay | Admitting: Obstetrics & Gynecology

## 2010-11-18 ENCOUNTER — Ambulatory Visit (HOSPITAL_COMMUNITY)
Admission: RE | Admit: 2010-11-18 | Discharge: 2010-11-18 | Disposition: A | Discharge status: Home / Self Care | Payer: Medicaid Other | Source: Ambulatory Visit | Attending: Obstetrics & Gynecology | Admitting: Obstetrics & Gynecology

## 2010-11-18 ENCOUNTER — Encounter (HOSPITAL_COMMUNITY): Payer: Self-pay | Admitting: *Deleted

## 2010-11-18 ENCOUNTER — Encounter (HOSPITAL_COMMUNITY): Payer: Self-pay | Admitting: Anesthesiology

## 2010-11-18 ENCOUNTER — Encounter (HOSPITAL_COMMUNITY)
Admission: RE | Disposition: A | Discharge status: Home / Self Care | Payer: Self-pay | Source: Ambulatory Visit | Attending: Obstetrics & Gynecology

## 2010-11-18 DIAGNOSIS — Z9889 Other specified postprocedural states: Secondary | ICD-10-CM

## 2010-11-18 DIAGNOSIS — K429 Umbilical hernia without obstruction or gangrene: Secondary | ICD-10-CM | POA: Insufficient documentation

## 2010-11-18 DIAGNOSIS — Z01812 Encounter for preprocedural laboratory examination: Secondary | ICD-10-CM | POA: Insufficient documentation

## 2010-11-18 DIAGNOSIS — Z302 Encounter for sterilization: Secondary | ICD-10-CM | POA: Insufficient documentation

## 2010-11-18 HISTORY — PX: LAPAROSCOPIC TUBAL LIGATION: SHX1937

## 2010-11-18 HISTORY — PX: UMBILICAL HERNIA REPAIR: SHX196

## 2010-11-18 SURGERY — LIGATION, FALLOPIAN TUBE, LAPAROSCOPIC
Anesthesia: General | Wound class: Clean

## 2010-11-18 MED ORDER — KETOROLAC TROMETHAMINE 10 MG PO TABS: 10.0000 mg | ORAL_TABLET | Freq: Three times a day (TID) | ORAL | Status: AC | PRN

## 2010-11-18 MED ORDER — ONDANSETRON HCL 4 MG/2ML IJ SOLN: 4.0000 mg | Freq: Once | INTRAMUSCULAR | Status: DC | PRN

## 2010-11-18 MED ORDER — HYDROMORPHONE HCL 1 MG/ML IJ SOLN
0.5000 mg | Freq: Once | INTRAMUSCULAR | Status: AC
  Administered 2010-11-18: 0.5 mg via INTRAVENOUS

## 2010-11-18 MED ORDER — SUCCINYLCHOLINE CHLORIDE 20 MG/ML IJ SOLN
INTRAMUSCULAR | Status: AC
  Filled 2010-11-18: qty 1

## 2010-11-18 MED ORDER — MIDAZOLAM HCL 2 MG/2ML IJ SOLN
INTRAMUSCULAR | Status: AC
  Filled 2010-11-18: qty 2

## 2010-11-18 MED ORDER — ONDANSETRON HCL 8 MG PO TABS: 8.0000 mg | ORAL_TABLET | Freq: Three times a day (TID) | ORAL | Status: AC | PRN

## 2010-11-18 MED ORDER — ROCURONIUM BROMIDE 50 MG/5ML IV SOLN
INTRAVENOUS | Status: AC
  Filled 2010-11-18: qty 1

## 2010-11-18 MED ORDER — GLYCOPYRROLATE 0.2 MG/ML IJ SOLN
INTRAMUSCULAR | Status: DC | PRN
  Administered 2010-11-18: .4 mg via INTRAVENOUS
  Administered 2010-11-18: 0.2 mg via INTRAVENOUS

## 2010-11-18 MED ORDER — ONDANSETRON HCL 4 MG/2ML IJ SOLN
4.0000 mg | Freq: Once | INTRAMUSCULAR | Status: AC
  Administered 2010-11-18: 4 mg via INTRAVENOUS

## 2010-11-18 MED ORDER — FENTANYL CITRATE 0.05 MG/ML IJ SOLN
INTRAMUSCULAR | Status: AC
  Filled 2010-11-18: qty 2

## 2010-11-18 MED ORDER — PROPOFOL 10 MG/ML IV EMUL
INTRAVENOUS | Status: DC | PRN
  Administered 2010-11-18: 120 mg via INTRAVENOUS
  Administered 2010-11-18: 50 mg via INTRAVENOUS

## 2010-11-18 MED ORDER — SODIUM CHLORIDE 0.9 % IR SOLN
Status: DC | PRN
  Administered 2010-11-18: 1000 mL

## 2010-11-18 MED ORDER — GLYCOPYRROLATE 0.2 MG/ML IJ SOLN
INTRAMUSCULAR | Status: AC
  Filled 2010-11-18: qty 1

## 2010-11-18 MED ORDER — PROPOFOL 10 MG/ML IV EMUL
INTRAVENOUS | Status: AC
  Filled 2010-11-18: qty 20

## 2010-11-18 MED ORDER — MIDAZOLAM HCL 2 MG/2ML IJ SOLN
1.0000 mg | INTRAMUSCULAR | Status: DC | PRN
  Administered 2010-11-18: 2 mg via INTRAVENOUS

## 2010-11-18 MED ORDER — CEFAZOLIN SODIUM 1-5 GM-% IV SOLN
INTRAVENOUS | Status: DC | PRN
  Administered 2010-11-18: 1 g via INTRAVENOUS

## 2010-11-18 MED ORDER — CEFAZOLIN SODIUM 1-5 GM-% IV SOLN: 1.0000 g | INTRAVENOUS | Status: DC

## 2010-11-18 MED ORDER — FENTANYL CITRATE 0.05 MG/ML IJ SOLN
25.0000 ug | INTRAMUSCULAR | Status: DC | PRN
  Administered 2010-11-18 (×2): 50 ug via INTRAVENOUS

## 2010-11-18 MED ORDER — FENTANYL CITRATE 0.05 MG/ML IJ SOLN
INTRAMUSCULAR | Status: AC
  Administered 2010-11-18: 50 ug via INTRAVENOUS
  Filled 2010-11-18: qty 2

## 2010-11-18 MED ORDER — LACTATED RINGERS IV SOLN
INTRAVENOUS | Status: DC
  Administered 2010-11-18: 09:00:00 via INTRAVENOUS

## 2010-11-18 MED ORDER — OXYCODONE-ACETAMINOPHEN 7.5-500 MG PO TABS: 1.0000 | ORAL_TABLET | Freq: Four times a day (QID) | ORAL | Status: AC | PRN

## 2010-11-18 MED ORDER — HYDROMORPHONE HCL 1 MG/ML IJ SOLN
INTRAMUSCULAR | Status: AC
  Administered 2010-11-18: 0.5 mg via INTRAVENOUS
  Filled 2010-11-18: qty 1

## 2010-11-18 MED ORDER — ROCURONIUM BROMIDE 100 MG/10ML IV SOLN
INTRAVENOUS | Status: DC | PRN
  Administered 2010-11-18: 25 mg via INTRAVENOUS
  Administered 2010-11-18: 5 mg via INTRAVENOUS

## 2010-11-18 MED ORDER — LIDOCAINE HCL (PF) 1 % IJ SOLN
INTRAMUSCULAR | Status: AC
  Filled 2010-11-18: qty 5

## 2010-11-18 MED ORDER — KETOROLAC TROMETHAMINE 30 MG/ML IJ SOLN
INTRAMUSCULAR | Status: AC
  Administered 2010-11-18: 30 mg via INTRAVENOUS
  Filled 2010-11-18: qty 1

## 2010-11-18 MED ORDER — ONDANSETRON HCL 4 MG/2ML IJ SOLN
INTRAMUSCULAR | Status: AC
  Administered 2010-11-18: 4 mg via INTRAVENOUS
  Filled 2010-11-18: qty 2

## 2010-11-18 MED ORDER — NEOSTIGMINE METHYLSULFATE 1 MG/ML IJ SOLN
INTRAMUSCULAR | Status: DC | PRN
  Administered 2010-11-18: 2 mg via INTRAVENOUS

## 2010-11-18 MED ORDER — CEFAZOLIN SODIUM 1-5 GM-% IV SOLN
INTRAVENOUS | Status: AC
  Filled 2010-11-18: qty 50

## 2010-11-18 MED ORDER — FENTANYL CITRATE 0.05 MG/ML IJ SOLN
INTRAMUSCULAR | Status: DC | PRN
  Administered 2010-11-18 (×4): 50 ug via INTRAVENOUS

## 2010-11-18 MED ORDER — LIDOCAINE HCL 1 % IJ SOLN
INTRAMUSCULAR | Status: DC | PRN
  Administered 2010-11-18: 30 mg via INTRADERMAL

## 2010-11-18 SURGICAL SUPPLY — 30 items
BAG HAMPER (MISCELLANEOUS) ×3 IMPLANT
BLADE SURG SZ11 CARB STEEL (BLADE) ×3 IMPLANT
CLOTH BEACON ORANGE TIMEOUT ST (SAFETY) ×3 IMPLANT
COVER LIGHT HANDLE STERIS (MISCELLANEOUS) ×6 IMPLANT
ELECT REM PT RETURN 9FT ADLT (ELECTROSURGICAL) ×3
ELECTRODE REM PT RTRN 9FT ADLT (ELECTROSURGICAL) ×2 IMPLANT
GLOVE BIOGEL PI IND STRL 8 (GLOVE) ×2 IMPLANT
GLOVE BIOGEL PI INDICATOR 8 (GLOVE) ×1
GLOVE ECLIPSE 6.5 STRL STRAW (GLOVE) ×3 IMPLANT
GLOVE ECLIPSE 8.0 STRL XLNG CF (GLOVE) ×3 IMPLANT
GLOVE EXAM NITRILE MD LF STRL (GLOVE) ×6 IMPLANT
GLOVE INDICATOR 7.0 STRL GRN (GLOVE) ×3 IMPLANT
GOWN BRE IMP SLV AUR XL STRL (GOWN DISPOSABLE) ×3 IMPLANT
GOWN STRL REIN XL XLG (GOWN DISPOSABLE) ×3 IMPLANT
INST SET LAPROSCOPIC GYN AP (KITS) ×3 IMPLANT
KIT ROOM TURNOVER AP CYSTO (KITS) ×3 IMPLANT
MANIFOLD NEPTUNE II (INSTRUMENTS) ×3 IMPLANT
NEEDLE INSUFFLATION 14GA 120MM (NEEDLE) ×3 IMPLANT
PACK PERI GYN (CUSTOM PROCEDURE TRAY) ×3 IMPLANT
PAD ARMBOARD 7.5X6 YLW CONV (MISCELLANEOUS) ×3 IMPLANT
SET BASIN LINEN APH (SET/KITS/TRAYS/PACK) ×3 IMPLANT
SOLUTION ANTI FOG 6CC (MISCELLANEOUS) ×3 IMPLANT
SPONGE GAUZE 2X2 8PLY STRL LF (GAUZE/BANDAGES/DRESSINGS) ×3 IMPLANT
STAPLER VISISTAT 35W (STAPLE) ×3 IMPLANT
SUT VICRYL 0 UR6 27IN ABS (SUTURE) ×9 IMPLANT
SYRINGE 10CC LL (SYRINGE) ×3 IMPLANT
TAPE CLOTH SURG 4X10 WHT LF (GAUZE/BANDAGES/DRESSINGS) ×3 IMPLANT
TROCAR Z-THRD FIOS HNDL 11X100 (TROCAR) ×3 IMPLANT
TUBING INSUFFLATION HIGH FLOW (TUBING) ×3 IMPLANT
WARMER LAPAROSCOPE (MISCELLANEOUS) ×3 IMPLANT

## 2010-11-18 NOTE — Transfer of Care (Signed)
Immediate Anesthesia Transfer of Care Note  Patient: Rachel Parsons  Procedure(s) Performed:  LAPAROSCOPIC TUBAL LIGATION; HERNIA REPAIR UMBILICAL ADULT  Patient Location: PACU  Anesthesia Type: General  Level of Consciousness: awake, alert  and oriented  Airway & Oxygen Therapy: Patient Spontanous Breathing and Patient connected to nasal cannula oxygen  Post-op Assessment: Report given to PACU RN  Post vital signs: stable  Complications: No apparent anesthesia complications

## 2010-11-18 NOTE — OR Nursing (Signed)
Breast feeding baby at present , husband and family member  visiting

## 2010-11-18 NOTE — Anesthesia Procedure Notes (Addendum)
Procedure Name: Intubation Date/Time: 11/18/2010 10:40 AM Performed by: Glynn Octave Pre-anesthesia Checklist: Patient identified, Patient being monitored, Timeout performed, Emergency Drugs available and Suction available Patient Re-evaluated:Patient Re-evaluated prior to inductionOxygen Delivery Method: Circle System Utilized Preoxygenation: Pre-oxygenation with 100% oxygen Intubation Type: IV induction Ventilation: Mask ventilation without difficulty Laryngoscope Size: Mac and 3 Grade View: Grade II Tube type: Oral Tube size: 7.0 mm Number of attempts: 1 Airway Equipment and Method: stylet Placement Confirmation: ETT inserted through vocal cords under direct vision,  positive ETCO2 and breath sounds checked- equal and bilateral Secured at: 21 cm Tube secured with: Tape Dental Injury: Teeth and Oropharynx as per pre-operative assessment

## 2010-11-18 NOTE — Interval H&P Note (Signed)
History and Physical Interval Note:   11/18/2010   7:51 AM   Rachel Parsons  has presented today for surgery, with the diagnosis of sterilization request umbilical hernia  The various methods of treatment have been discussed with the patient and family. After consideration of risks, benefits and other options for treatment, the patient has consented to  Procedure(s): LAPAROSCOPIC TUBAL LIGATION HERNIA REPAIR UMBILICAL ADULT as a surgical intervention .  I have reviewed the patients' chart and labs.  Questions were answered to the patient's satisfaction.     Lazaro Arms  MD

## 2010-11-18 NOTE — Progress Notes (Signed)
Mod amt red bloody vag drainage. Perineum cleansed. Clean peri pad applied. Tolerated well.

## 2010-11-18 NOTE — OR Nursing (Signed)
Up to bathroom to void 

## 2010-11-18 NOTE — Progress Notes (Signed)
Awake. Rates pain 3. Refuses further fentanyl pain med.

## 2010-11-18 NOTE — Anesthesia Preprocedure Evaluation (Signed)
Anesthesia Evaluation  Name, MR# and DOB Patient awake  General Assessment Comment  Reviewed: Allergy & Precautions, H&P , NPO status , Patient's Chart, lab work & pertinent test results  History of Anesthesia Complications Negative for: history of anesthetic complications  Airway Mallampati: II  Neck ROM: Full    Dental  (+) Teeth Intact   Pulmonary    Pulmonary exam normal       Cardiovascular Regular Normal Hx A-Fib, resolved postpartum.   Neuro/Psych    GI/Hepatic   Endo/Other    Renal/GU      Musculoskeletal   Abdominal   Peds  Hematology   Anesthesia Other Findings   Reproductive/Obstetrics                           Anesthesia Physical Anesthesia Plan  ASA: II  Anesthesia Plan: General   Post-op Pain Management:    Induction: Intravenous  Airway Management Planned: Oral ETT  Additional Equipment:   Intra-op Plan:   Post-operative Plan: Extubation in OR  Informed Consent: I have reviewed the patients History and Physical, chart, labs and discussed the procedure including the risks, benefits and alternatives for the proposed anesthesia with the patient or authorized representative who has indicated his/her understanding and acceptance.     Plan Discussed with:   Anesthesia Plan Comments:         Anesthesia Quick Evaluation

## 2010-11-18 NOTE — Anesthesia Postprocedure Evaluation (Signed)
  Anesthesia Post-op Note  Patient: Rachel Parsons  Procedure(s) Performed:  LAPAROSCOPIC TUBAL LIGATION; HERNIA REPAIR UMBILICAL ADULT  Patient Location: PACU  Anesthesia Type: General  Level of Consciousness: awake, alert  and oriented  Airway and Oxygen Therapy: Patient Spontanous Breathing  Post-op Pain: mild  Post-op Assessment: Post-op Vital signs reviewed, Patient's Cardiovascular Status Stable, Respiratory Function Stable and No signs of Nausea or vomiting  Post-op Vital Signs: stable  Complications: No apparent anesthesia complications

## 2010-11-18 NOTE — Op Note (Signed)
Preoperative Diagnosis:  1.  Multiparous female desires permanent sterilization                                          2.  Umbilical hernia  Postoperative Diagnosis:  Same as above  Procedure:  1.  Laparoscopic Bilateral Tubal Ligation                     2.  Repair of supraumbilical hernia  Surgeon:  Rockne Coons MD  Anaesthesia:  LMA  Findings:  Patient had normal pelvic anatomy and no intraperitoneal abnormalities.  She had a small fascial defect but had herniated fat-filled peritoneum through it.  Description of Operation:  Patient was taken to the OR and placed into supine position where she underwent LMA anaesthesia.  She was placed in the dorsal lithotomy position and prepped and draped in the usual sterile fashion.  An incision was made in the umbilicus and dissection taken down to the rectus fascia which was incised and opened.  The non bladed trocar was then placed and the peritoneal cavity was insufflated.  The above noted findings were observed.  The Kleppinger electrocautery unit was employed and both tubes were burned to no resistance and beyond in the distal ishtmic and ampullary regions, approximately a 3.5 cm segment bilaterally.  There was good hemostasis.  The fascia, peritoneum and subcutaneous tissue were closed using 0 vicryl.  The skin was closed using staples.   Attention was then turned to the supraumbilical hernia.  A semi-lunar incision was made and carried down to the fascia.  The hernia sac and contents were encountered.  The fascial defect was identified.  The hernia sac and contents were dissected and cross clamped insuring it to be free of bowel.  It was removed and sent to path and a free tie placed.  The fascial defect was closed and the subcutaneous tissue was also closed.  The skin was closed with staples.  There was some bleeding from the cervix and a single figure of eight suture was placed.   The patient was awakened from anaesthesia and taken to the  PACU with all counts being correct x 3.  The patient received Ancef 1 gram and Toradol 30 mg IV preoperatively.  Tawnia Schirm H 11:33 AM 11/18/2010

## 2010-11-20 LAB — URINALYSIS, ROUTINE W REFLEX MICROSCOPIC
Bilirubin Urine: NEGATIVE
Glucose, UA: NEGATIVE
Glucose, UA: NEGATIVE
Hgb urine dipstick: NEGATIVE
Hgb urine dipstick: NEGATIVE
Specific Gravity, Urine: 1.015
Specific Gravity, Urine: 1.025
Urobilinogen, UA: 0.2
pH: 5.5

## 2010-11-20 LAB — CBC
HCT: 29.4 — ABNORMAL LOW
HCT: 31 — ABNORMAL LOW
Hemoglobin: 9.9 — ABNORMAL LOW
MCHC: 33.3
MCHC: 34.6
MCV: 84.3
MCV: 85.3
Platelets: 214
Platelets: 232
Platelets: 257
RBC: 3.28 — ABNORMAL LOW
RDW: 13.7
RDW: 14.1
WBC: 10.5
WBC: 14.9 — ABNORMAL HIGH

## 2010-11-20 LAB — COMPREHENSIVE METABOLIC PANEL
ALT: 13
Albumin: 2.6 — ABNORMAL LOW
Alkaline Phosphatase: 116
BUN: 7
Chloride: 106
Potassium: 3.5
Sodium: 136
Total Bilirubin: 0.5
Total Protein: 5.7 — ABNORMAL LOW

## 2010-11-20 LAB — RAPID HIV SCREEN (WH-MAU): Rapid HIV Screen: NONREACTIVE

## 2010-11-20 LAB — AMYLASE: Amylase: 90

## 2010-11-24 ENCOUNTER — Encounter (HOSPITAL_COMMUNITY): Payer: Self-pay | Admitting: Obstetrics & Gynecology

## 2010-12-16 ENCOUNTER — Other Ambulatory Visit: Payer: Self-pay | Admitting: Obstetrics & Gynecology

## 2010-12-16 ENCOUNTER — Other Ambulatory Visit (HOSPITAL_COMMUNITY)
Admission: RE | Admit: 2010-12-16 | Discharge: 2010-12-16 | Disposition: A | Discharge status: Home / Self Care | Payer: Medicaid Other | Source: Ambulatory Visit | Attending: Obstetrics & Gynecology | Admitting: Obstetrics & Gynecology

## 2010-12-16 DIAGNOSIS — Z01419 Encounter for gynecological examination (general) (routine) without abnormal findings: Secondary | ICD-10-CM | POA: Insufficient documentation

## 2011-07-22 IMAGING — CR DG CHEST 1V PORT
1 series · 1 of 1 positions shown · non-contrast
Comparison: 07/01/2010 CT.

CLINICAL DATA: Shortness of breath.  Pregnant.

PORTABLE CHEST - 1 VIEW

[view not recorded]
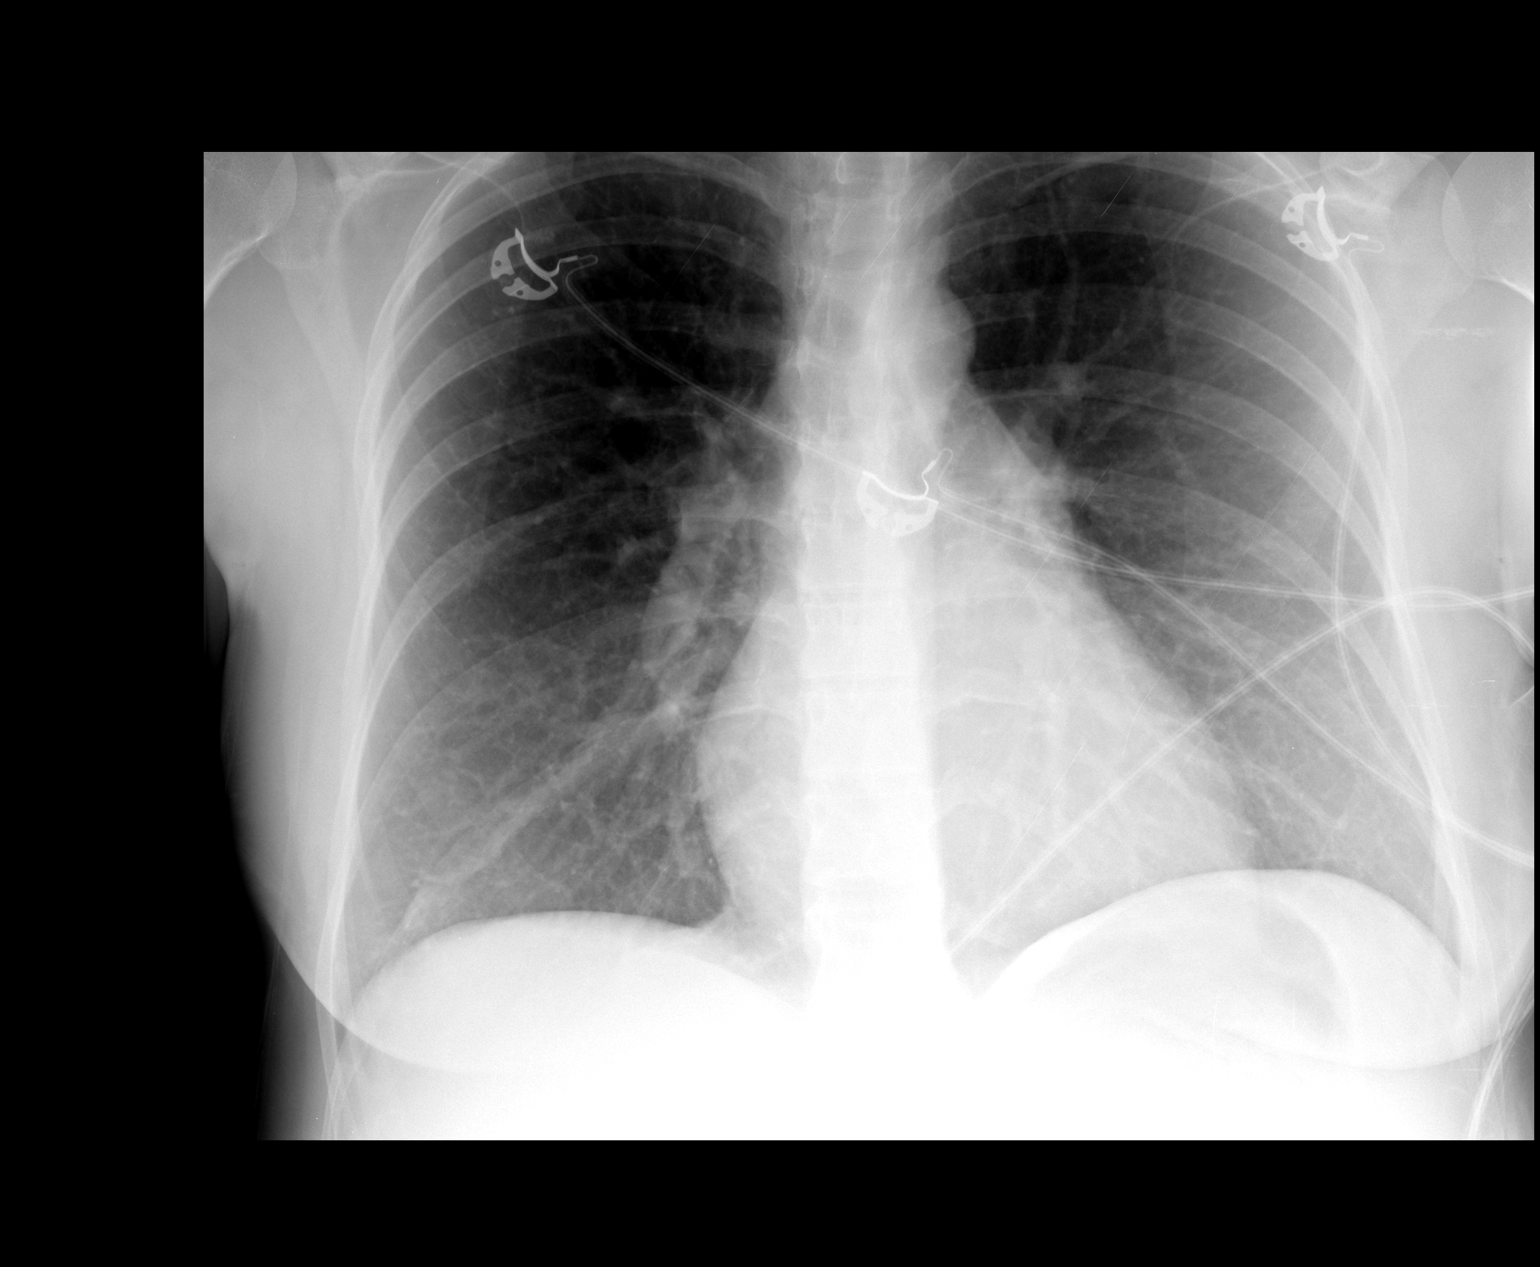

[1 of 1 positions shown; findings below may reference images not displayed]

FINDINGS: Scarring versus atelectasis right base.  No segmental
consolidation or gross pneumothorax.  Central pulmonary vascular
prominence without pulmonary edema.  Heart size within normal
limits.

Please see report of recent chest CT.
IMPRESSION: carring versus atelectasis right base.

No segmental consolidation or gross pneumothorax.

Central pulmonary vascular prominence without pulmonary edema.

Heart size within normal limits.

Please see report of recent chest CT.

## 2011-07-23 IMAGING — CT CT ANGIO CHEST
2 of 6 series · 19 of 46 positions shown · IV contrast (agent unspecified)
Comparison: 07/01/2010

CLINICAL DATA: Shortness of breath.  Fatigue and weakness.
Tachycardia

CT ANGIOGRAPHY CHEST WITH CONTRAST
TECHNIQUE: Multidetector CT imaging of the chest was performed
using the standard protocol during bolus administration of
intravenous contrast.  Multiplanar CT image reconstructions
including MIPs were obtained to evaluate the vascular anatomy.
Contrast:  80 ml of omni 300

[Series 4: pulm embolism 1.0 b25f thin · axial · 0.62mm/px · z∈[-179,-21]mm · 16 of 174 slices shown]
[im 8/174  lung]
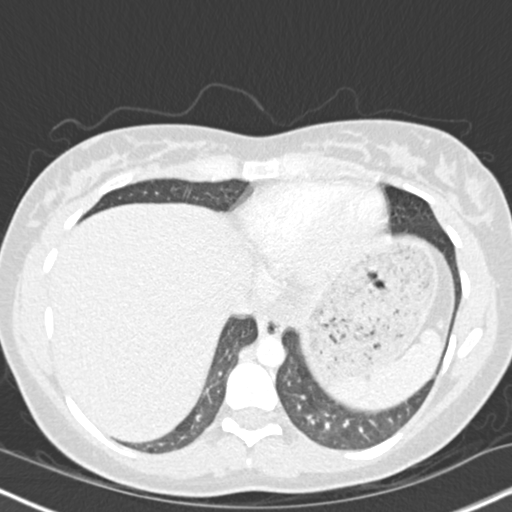
[im 23/174  soft-tissue]
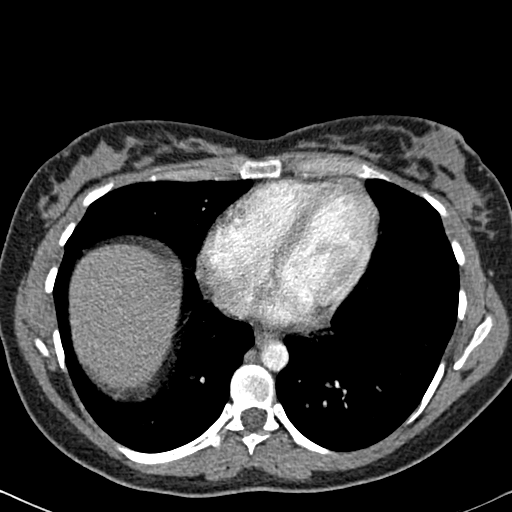
[im 31/174  lung]
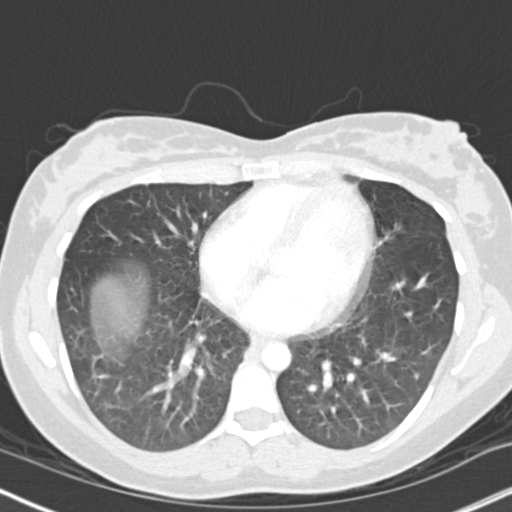
[im 38/174  soft-tissue]
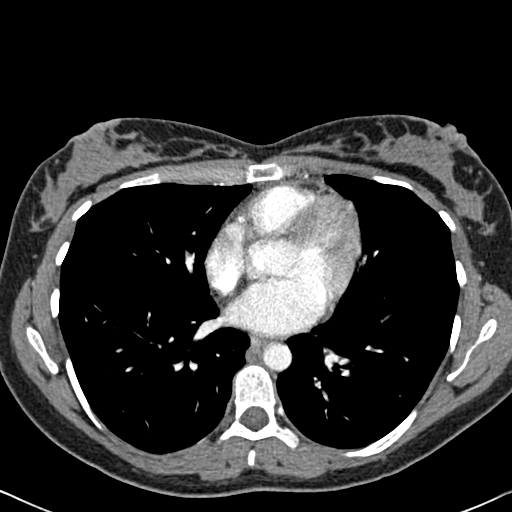
[im 53/174  lung]
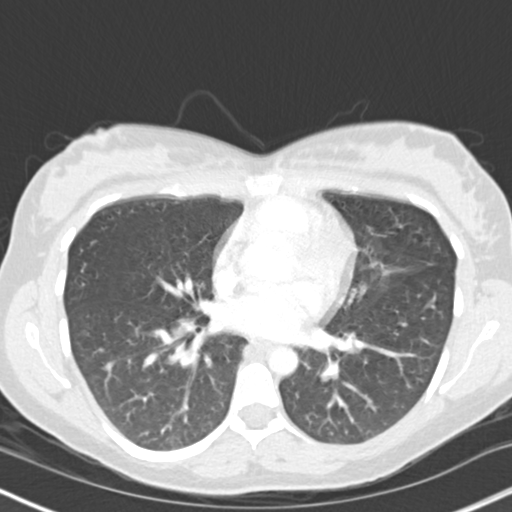
[im 61/174  soft-tissue]
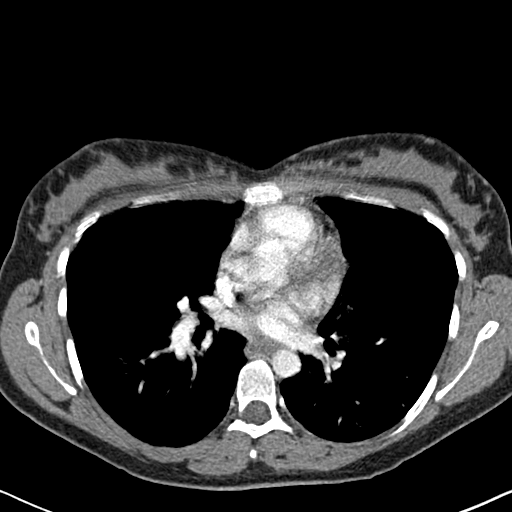
[im 68/174  lung]
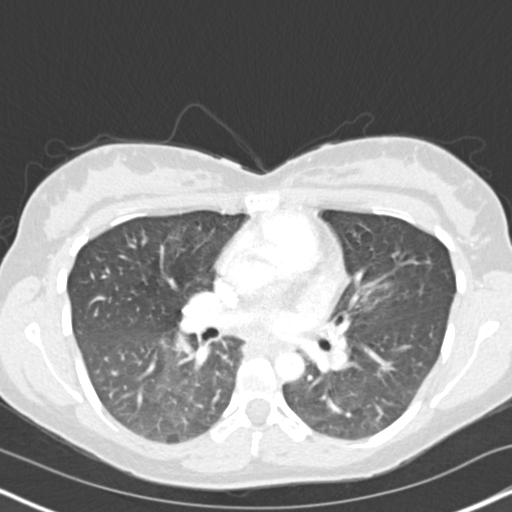
[im 83/174  soft-tissue]
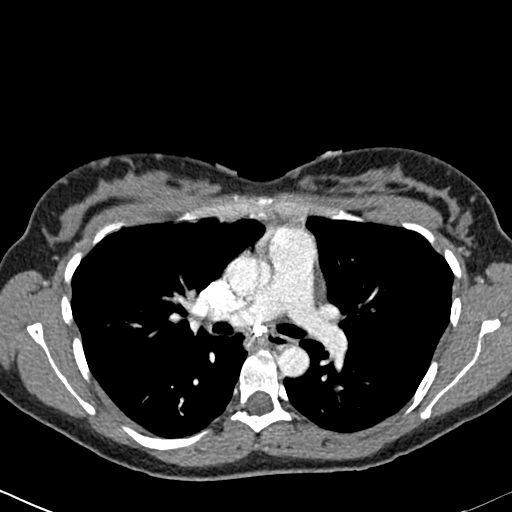
[im 91/174  lung]
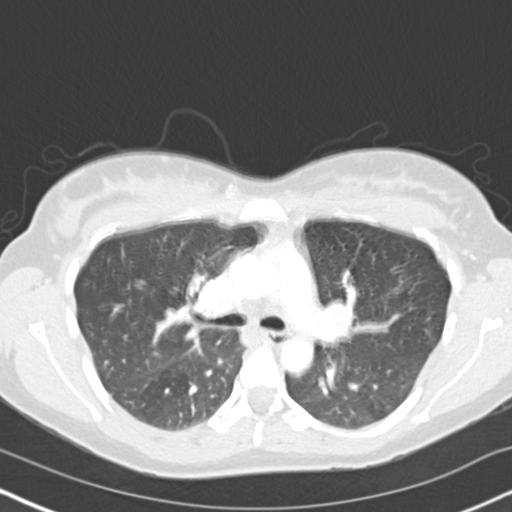
[im 106/174  soft-tissue]
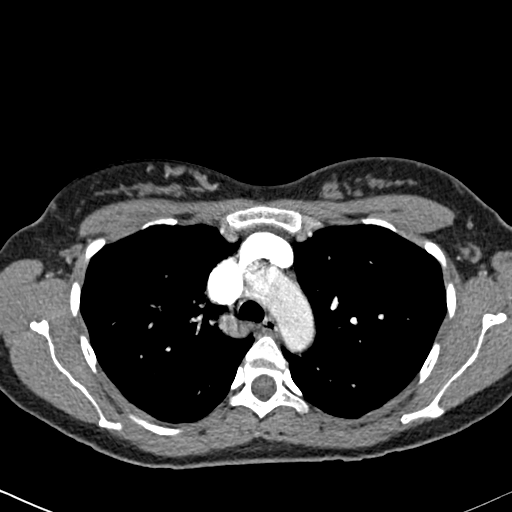
[im 113/174  lung]
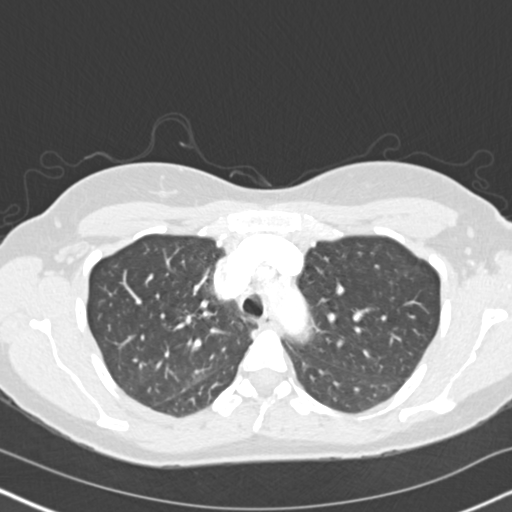
[im 121/174  soft-tissue]
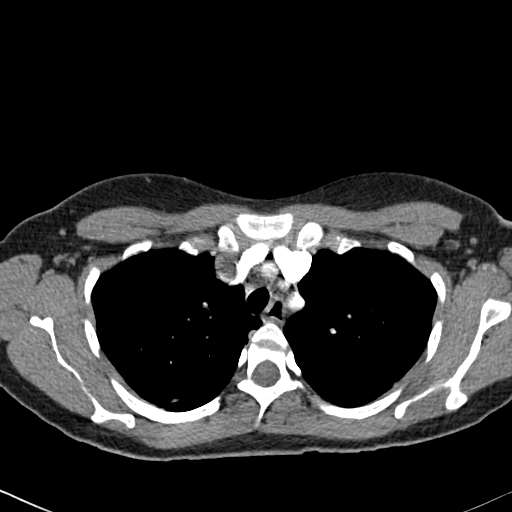
[im 136/174  lung]
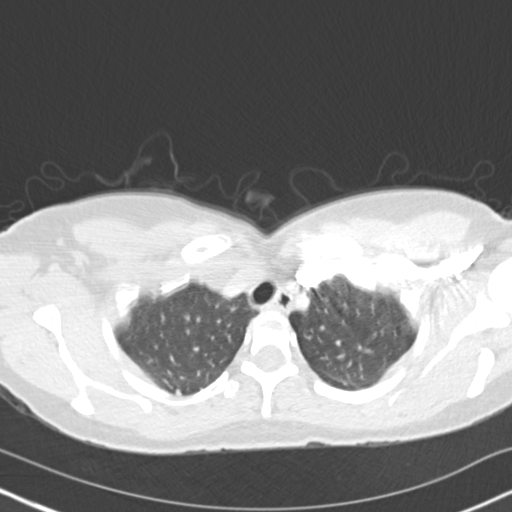
[im 143/174  soft-tissue]
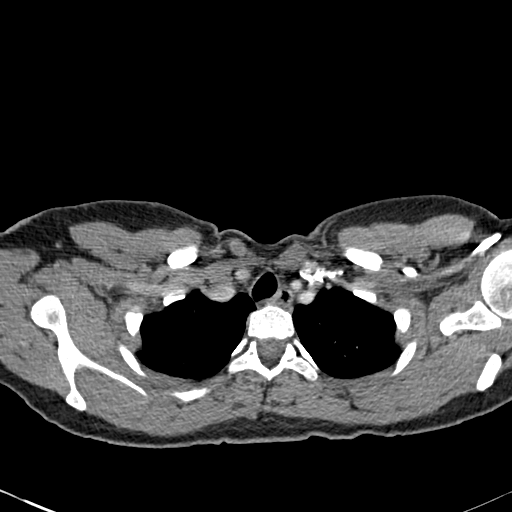
[im 151/174  lung]
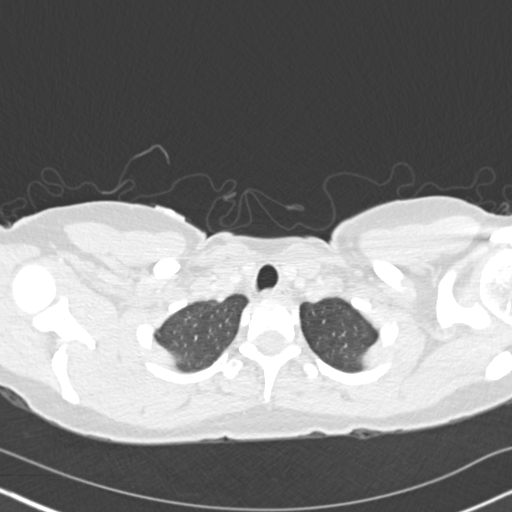
[im 166/174  soft-tissue]
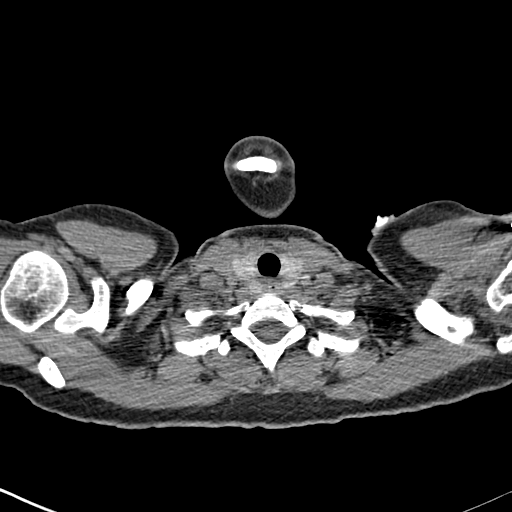

[Series 602: cor · coronal · 0.62mm/px · 3 of 88 slices shown]
[im 22/88  soft-tissue]
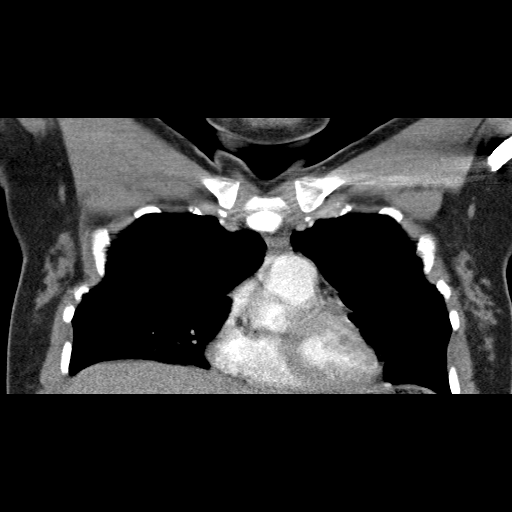
[im 44/88  soft-tissue]
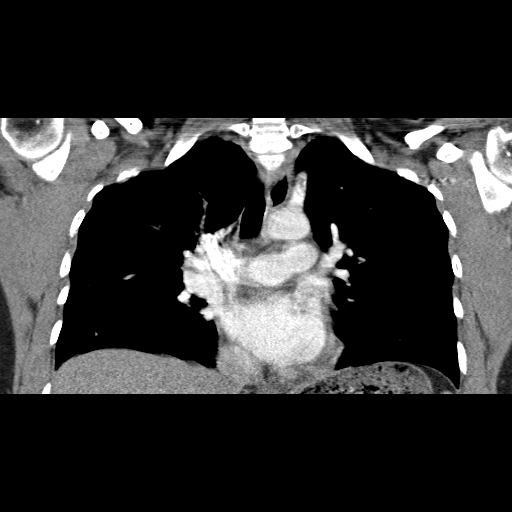
[im 66/88  soft-tissue]
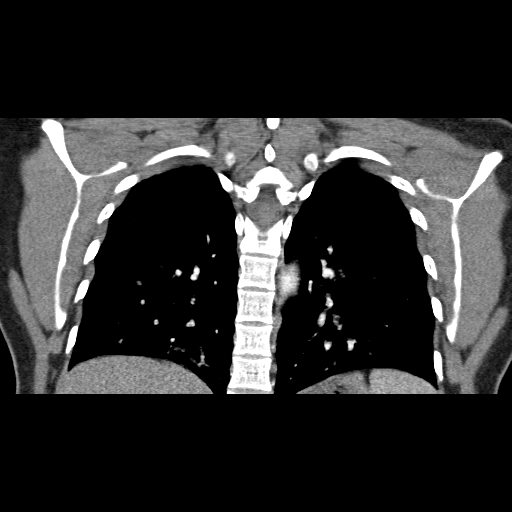

[19 of 46 positions shown; findings below may reference images not displayed]

FINDINGS: Appropriate shielding was utilized.

Exam detail is diminished due to respiratory motion artifact.

There are no abnormal filling defects within the main pulmonary
artery or its branches diagnostic for acute pulmonary embolus.

Stable subpleural nodule within the right upper lobe measuring
mm, image 14.  No airspace consolidation identified.

Within the left lower lobe there is a pulmonary nodule measuring 5
mm, image 39.  This is unchanged from previous exam.

Also in the left lower lobe there is a nodule measuring 2 mm, image
45.  Similar to previous exam.

There is no interstitial edema or airspace consolidation
identified.

The trachea is midline and appears patent.

Calcified subcarinal and hilar lymph nodes identified.

No pericardial or pleural effusions.

Review of the visualized osseous structures is unremarkable.

Review of the MIP images confirms the above findings.
IMPRESSION: 1.  No pulmonary emboli identified.
2.  Small pulmonary nodules, stable from prior exam.
3.  Old granulomatous infection with calcified nodes in the
mediastinum and hilar regions

## 2011-08-28 ENCOUNTER — Emergency Department (HOSPITAL_COMMUNITY)
Admission: EM | Admit: 2011-08-28 | Discharge: 2011-08-28 | Disposition: A | Discharge status: Home / Self Care | Payer: Self-pay | Source: Home / Self Care | Attending: Emergency Medicine | Admitting: Emergency Medicine

## 2011-08-28 ENCOUNTER — Encounter (HOSPITAL_COMMUNITY): Payer: Self-pay | Admitting: Emergency Medicine

## 2011-08-28 DIAGNOSIS — S30860A Insect bite (nonvenomous) of lower back and pelvis, initial encounter: Secondary | ICD-10-CM

## 2011-08-28 DIAGNOSIS — T148XXA Other injury of unspecified body region, initial encounter: Secondary | ICD-10-CM

## 2011-08-28 MED ORDER — AMOXICILLIN 500 MG PO CAPS: 500.0000 mg | ORAL_CAPSULE | Freq: Three times a day (TID) | ORAL | Status: DC

## 2011-08-28 MED ORDER — AMOXICILLIN 500 MG PO CAPS: 500.0000 mg | ORAL_CAPSULE | Freq: Three times a day (TID) | ORAL | Status: AC

## 2011-08-28 NOTE — ED Notes (Signed)
Patient returned to front desk and reported she thinks the pharmacy is closed.  Requesting med be sent to [facility].  Deliah Boston, np printed script for patient to take to pharmacy of choice.

## 2011-08-28 NOTE — ED Notes (Signed)
Reports tick located on back last Sunday.  Reports husband removed tick, reports some swelling to the site, redness, itching.  Denies fever, denies feeling bad

## 2011-08-28 NOTE — ED Provider Notes (Signed)
History     CSN: [id number]  Arrival date & time 08/28/11  1625   First MD Initiated Contact with Patient 08/28/11 1630      Chief Complaint  Patient presents with  . Tick Removal    (Consider location/radiation/quality/duration/timing/severity/associated sxs/prior treatment) HPI Comments: Pt states that she and her husband pulled a tick off of her 6 days ago:pt states that she developed some localized redness to her back:pt denies any generalized rashes, fever or myalgias:pt states that she is breast finding and concerned about antibiotic exposure  The history is provided by the patient. No language interpreter was used.    Past Medical History  Diagnosis Date  . Palpitation   . DVT (deep venous thrombosis)     after knee surgery  . Atrial fibrillation   . History of atrial fibrillation     postural orthostatic tachycardia syndrome  . Abnormal Pap smear     Past Surgical History  Procedure Date  . Childbirth     times 5  . Tonsillectomy   . Knee arthrocentesis   . Knee arthroplasty     right  . Cardioversion May 2012    pt. states heart spontaneously converted before cardioversion  . Laparoscopic tubal ligation 11/18/2010    Procedure: LAPAROSCOPIC TUBAL LIGATION;  Surgeon: Lazaro Arms, MD;  Location: [facility];  Service: Gynecology;  Laterality: Bilateral;  . Umbilical hernia repair 11/18/2010    Procedure: HERNIA REPAIR UMBILICAL ADULT;  Surgeon: Lazaro Arms, MD;  Location: [facility];  Service: Gynecology;  Laterality: N/A;    Family History  Problem Relation Age of Onset  . Cancer Maternal Grandmother   . Cancer Maternal Grandfather   . Anesthesia problems Neg Hx   . Hypotension Neg Hx   . Malignant hyperthermia Neg Hx   . Pseudochol deficiency Neg Hx     History  Substance Use Topics  . Smoking status: Never Smoker   . Smokeless tobacco: Never Used  . Alcohol Use: No    OB History    Grav Para Term Preterm Abortions TAB SAB Ect Mult Living   8 6 6  2  1 1   6       Review of Systems  Constitutional: Negative.   Respiratory: Negative.   Cardiovascular: Negative.   Neurological: Negative.     Allergies  Erythromycin and Sulfa antibiotics  Home Medications   Current Outpatient Rx  Name Route Sig Dispense Refill  . CALCIUM CARBONATE ANTACID 750 MG PO CHEW Oral Chew 1 tablet by mouth daily.      Marland Kitchen PRENATABS RX PO Oral Take 1 tablet by mouth daily.        BP 106/63  Pulse 76  Temp 98.2 F (36.8 C) (Oral)  Resp 17  SpO2 97%  Breastfeeding? Yes  Physical Exam  Nursing note and vitals reviewed. Constitutional: She appears well-developed and well-nourished.  Cardiovascular: Normal rate and regular rhythm.   Pulmonary/Chest: Effort normal and breath sounds normal.  Skin:       Pt has a localized area of redness to the left upper back:no sign of tick noted to the area    ED Course  Procedures (including critical care time)  Labs Reviewed - No data to display No results found.   1. Tick bite of back       MDM  Will treat with amoxicillin as pt is breastfeeding:        Teressa Lower, NP 08/28/11 1740

## 2011-08-28 NOTE — ED Provider Notes (Signed)
Medical screening examination/treatment/procedure(s) were performed by non-physician practitioner and as supervising physician I was immediately available for consultation/collaboration.  Luiz Blare MD   Luiz Blare, MD 08/28/11 [id number]

## 2011-09-01 ENCOUNTER — Telehealth (HOSPITAL_COMMUNITY): Payer: Self-pay | Admitting: *Deleted

## 2011-09-01 NOTE — ED Notes (Signed)
7/16  Pt. called and said she is taking Amoxicillin for tickbite.  She is breastfeeding and states her baby's bottom is getting red and weepy.  She asked if she could switch to something different.  I told her I would call back. 7/17 Discussed with Dr. Chaney Malling. She said she needs to finish all of the antibiotic. Any other antibiotic could give these same symptoms to her baby.  She should call the pediatrician and ask for a yeast medication for the diaper rash.  I called and left a message to call. Vassie Moselle 09/01/2011

## 2011-09-03 ENCOUNTER — Telehealth (HOSPITAL_COMMUNITY): Payer: Self-pay | Admitting: *Deleted

## 2011-09-03 NOTE — ED Notes (Signed)
I called pt. and gave her Dr. Andreas Blower recommendations as previously noted. She voiced understanding. Vassie Moselle 09/03/2011

## 2012-05-16 ENCOUNTER — Ambulatory Visit (INDEPENDENT_AMBULATORY_CARE_PROVIDER_SITE_OTHER): Payer: Self-pay | Admitting: Obstetrics & Gynecology

## 2012-05-16 ENCOUNTER — Encounter: Payer: Self-pay | Admitting: Obstetrics & Gynecology

## 2012-05-16 VITALS — BP 100/60 | Ht 63.0 in | Wt 121.0 lb

## 2012-05-16 DIAGNOSIS — F329 Major depressive disorder, single episode, unspecified: Secondary | ICD-10-CM

## 2012-05-16 DIAGNOSIS — F53 Postpartum depression: Secondary | ICD-10-CM | POA: Insufficient documentation

## 2012-05-16 DIAGNOSIS — O99345 Other mental disorders complicating the puerperium: Secondary | ICD-10-CM

## 2012-05-16 MED ORDER — SERTRALINE HCL 25 MG PO TABS: 50.0000 mg | ORAL_TABLET | Freq: Every day | ORAL | Status: DC

## 2012-05-16 NOTE — Patient Instructions (Addendum)

## 2012-05-16 NOTE — Progress Notes (Signed)
Patient ID: Rachel Parsons, female   DOB: 15-Oct-1980, 32 y.o.   MRN: [id number] Subjective:     Rachel Parsons is a 32 y.o. female who presents for follow up of depression. Current symptoms include anhedonia, depressed mood and withdrawal. Symptoms have been gradually improving since that time. Patient denies psychomotor agitation and suicidal thoughts without plan. Previous treatment includes: none. She complains of the following side effects from the treatment: none. Current tretment is Zoloft 25 mg daily.  The following portions of the patient's history were reviewed and updated as appropriate: allergies, current medications, past family history, past medical history, past social history, past surgical history and problem list.  Review of Systems Pertinent items are noted in HPI.    Objective:    BP 100/60  Ht 5\' 3"  (1.6 m)  Wt 121 lb (54.885 kg)  BMI 21.44 kg/m2  LMP 04/19/2012  Breastfeeding? Yes  General:  alert, cooperative and no distress  Affect & Behavior:  full facial expressions, good grooming, good insight, normal perception, normal reasoning, normal speech pattern and content and normal thought patterns none      Assessment:    Depression, gradually improving      Plan:    Medications: Zoloft. Handouts describing disease, natural history, and treatment were given to the patient. Instructed patient to contact office or on-call physician promptly should condition worsen or any new symptoms appear and provided on-call telephone numbers. IF THE PATIENT HAS ANY SUICIDAL OR HOMICIDAL IDEATIONS, CALL THE OFFICE, DISCUSS WITH A SUPPORT MEMBER, OR GO TO THE ER IMMEDIATELY. Patient was agreeable with this plan. Follow up: 3 months. Spent 15 minutes (>50% of visit) discussing the risks of depression, the  pathophysiology, etiology, risks, and principles of treatment.   Increase zoloft to 50 mg daily.

## 2012-05-19 ENCOUNTER — Telehealth: Payer: Self-pay | Admitting: Obstetrics & Gynecology

## 2012-05-19 MED ORDER — SERTRALINE HCL 25 MG PO TABS: 50.0000 mg | ORAL_TABLET | Freq: Every day | ORAL | Status: DC

## 2012-05-19 NOTE — Telephone Encounter (Signed)
I did mistakenly only dispense 30 tb instead of 60.  I called patient and let her know and resent a e prescription with 60 tablets or 2 a day.

## 2012-07-23 ENCOUNTER — Emergency Department (HOSPITAL_COMMUNITY)
Admission: EM | Admit: 2012-07-23 | Discharge: 2012-07-23 | Disposition: A | Discharge status: Home / Self Care | Payer: BC Managed Care – PPO | Source: Home / Self Care | Attending: Emergency Medicine | Admitting: Emergency Medicine

## 2012-07-23 ENCOUNTER — Encounter (HOSPITAL_COMMUNITY): Payer: Self-pay

## 2012-07-23 DIAGNOSIS — L255 Unspecified contact dermatitis due to plants, except food: Secondary | ICD-10-CM

## 2012-07-23 DIAGNOSIS — L247 Irritant contact dermatitis due to plants, except food: Secondary | ICD-10-CM

## 2012-07-23 MED ORDER — TRIAMCINOLONE ACETONIDE 0.1 % EX CREA: TOPICAL_CREAM | Freq: Two times a day (BID) | CUTANEOUS | Status: AC

## 2012-07-23 MED ORDER — PREDNISONE 20 MG PO TABS: 40.0000 mg | ORAL_TABLET | Freq: Every day | ORAL | Status: AC

## 2012-07-23 MED ORDER — HYDROXYZINE HCL 25 MG PO TABS: 25.0000 mg | ORAL_TABLET | Freq: Four times a day (QID) | ORAL | Status: DC

## 2012-07-23 NOTE — ED Provider Notes (Signed)
History     CSN: [id number]  Arrival date & time 07/23/12  1235   First MD Initiated Contact with Patient 07/23/12 1330      Chief Complaint  Patient presents with  . Poison Ivy    (Consider location/radiation/quality/duration/timing/severity/associated sxs/prior treatment) HPI Comments: She presents urgent care describing that she has developed an itchy rash in her legs left gluteal region and some on her forearms and it keeps spreading.Patient describes she worked outdoors a few days prior this rash first appeared. She suspects this is poison ivy. Patient denies any constitutional symptoms such as fevers, malaise, arthralgias myalgias or headaches. The rash itches and has seen some blistering formations especially in the anterior aspect of her left lower leg.  Patient is a 32 y.o. female presenting with poison ivy. The history is provided by the patient.  Poison Lajoyce [place] This is a new problem. The current episode started more than 2 days ago. The problem occurs constantly. The problem has not changed since onset.Pertinent negatives include no abdominal pain. Nothing relieves the symptoms.    Past Medical History  Diagnosis Date  . Palpitation   . DVT (deep venous thrombosis)     after knee surgery  . Atrial fibrillation   . History of atrial fibrillation     postural orthostatic tachycardia syndrome  . Abnormal Pap smear   . Depression   . Anxiety     Past Surgical History  Procedure Laterality Date  . Childbirth      times 5  . Tonsillectomy    . Knee arthrocentesis    . Knee arthroplasty      right  . Cardioversion  May 2012    pt. states heart spontaneously converted before cardioversion  . Laparoscopic tubal ligation  11/18/2010    Procedure: LAPAROSCOPIC TUBAL LIGATION;  Surgeon: Lazaro Arms, MD;  Location: [facility];  Service: Gynecology;  Laterality: Bilateral;  . Umbilical hernia repair  11/18/2010    Procedure: HERNIA REPAIR UMBILICAL ADULT;  Surgeon: Lazaro Arms,  MD;  Location: [facility];  Service: Gynecology;  Laterality: N/A;    Family History  Problem Relation Age of Onset  . Cancer Maternal Grandmother     brain  . Cancer Maternal Grandfather     throat  . Anesthesia problems Neg Hx   . Hypotension Neg Hx   . Malignant hyperthermia Neg Hx   . Pseudochol deficiency Neg Hx   . Cancer Maternal Aunt     lung and bone   . ALS Paternal Grandmother   . Diabetes Paternal Grandfather     History  Substance Use Topics  . Smoking status: Never Smoker   . Smokeless tobacco: Never Used  . Alcohol Use: No    OB History   Grav Para Term Preterm Abortions TAB SAB Ect Mult Living   8 6 6  2 1 1   6       Review of Systems  Constitutional: Negative for fever, chills, activity change, appetite change, fatigue and unexpected weight change.  HENT: Negative for neck stiffness.   Gastrointestinal: Negative for abdominal pain.  Musculoskeletal: Negative for myalgias, joint swelling and arthralgias.  Skin: Positive for color change and rash.    Allergies  Erythromycin and Sulfa antibiotics  Home Medications   Current Outpatient Rx  Name  Route  Sig  Dispense  Refill  . sertraline (ZOLOFT) 25 MG tablet   Oral   Take 2 tablets (50 mg total) by mouth daily.  60 tablet   3   . hydrOXYzine (ATARAX/VISTARIL) 25 MG tablet   Oral   Take 1 tablet (25 mg total) by mouth every 6 (six) hours.   12 tablet   0   . predniSONE (DELTASONE) 20 MG tablet   Oral   Take 2 tablets (40 mg total) by mouth daily.   15 tablet   0   . Prenatal Vit-Iron Carbonyl-FA (PRENATABS RX PO)   Oral   Take 1 tablet by mouth daily.           Marland Kitchen triamcinolone cream (KENALOG) 0.1 %   Topical   Apply topically 2 (two) times daily.   30 g   0     LMP 07/14/2012  Breastfeeding? Yes  Physical Exam  Nursing note and vitals reviewed. Constitutional: Vital signs are normal. She appears well-developed and well-nourished.  Non-toxic appearance. She does not have a  sickly appearance. She does not appear ill. No distress.  Eyes: Conjunctivae are normal.  Musculoskeletal: She exhibits no tenderness.  Neurological: She is alert.  Skin: Rash noted. No ecchymosis, no laceration and no petechiae noted. Rash is papular, maculopapular and vesicular. There is erythema.       ED Course  Procedures (including critical care time)  Labs Reviewed - No data to display No results found.   1. Contact dermatitis and eczema due to plant       MDM  Contact dermatitis. Exam consistent with, poison ivy or/oak patient was encouraged to use topical triamcinolone cream for a few days prior to committing herself to use prednisone. Have also been instructed to use hydroxyzine only if needed as patient is breast-feeding.       Jimmie Molly, MD 07/23/12 1534

## 2012-07-23 NOTE — ED Notes (Signed)
C/o possible posion ivy, states that she thinks it started approx 07/12/12 all over body

## 2012-08-01 ENCOUNTER — Ambulatory Visit: Payer: Self-pay | Admitting: Obstetrics & Gynecology

## 2012-08-15 ENCOUNTER — Ambulatory Visit: Payer: Self-pay | Admitting: Obstetrics & Gynecology

## 2012-10-18 ENCOUNTER — Other Ambulatory Visit: Payer: Self-pay | Admitting: Obstetrics & Gynecology

## 2013-12-17 ENCOUNTER — Encounter (HOSPITAL_COMMUNITY): Payer: Self-pay

## 2014-01-21 ENCOUNTER — Emergency Department (HOSPITAL_COMMUNITY)
Admission: EM | Admit: 2014-01-21 | Discharge: 2014-01-21 | Disposition: A | Discharge status: Home / Self Care | Payer: BC Managed Care – PPO | Attending: Emergency Medicine | Admitting: Emergency Medicine

## 2014-01-21 ENCOUNTER — Encounter (HOSPITAL_COMMUNITY): Payer: Self-pay | Admitting: *Deleted

## 2014-01-21 DIAGNOSIS — F419 Anxiety disorder, unspecified: Secondary | ICD-10-CM | POA: Diagnosis not present

## 2014-01-21 DIAGNOSIS — Z79899 Other long term (current) drug therapy: Secondary | ICD-10-CM | POA: Diagnosis not present

## 2014-01-21 DIAGNOSIS — Z96651 Presence of right artificial knee joint: Secondary | ICD-10-CM | POA: Diagnosis not present

## 2014-01-21 DIAGNOSIS — Z8679 Personal history of other diseases of the circulatory system: Secondary | ICD-10-CM | POA: Diagnosis not present

## 2014-01-21 DIAGNOSIS — J029 Acute pharyngitis, unspecified: Secondary | ICD-10-CM | POA: Insufficient documentation

## 2014-01-21 DIAGNOSIS — Z86718 Personal history of other venous thrombosis and embolism: Secondary | ICD-10-CM | POA: Insufficient documentation

## 2014-01-21 DIAGNOSIS — F329 Major depressive disorder, single episode, unspecified: Secondary | ICD-10-CM | POA: Insufficient documentation

## 2014-01-21 LAB — RAPID STREP SCREEN (MED CTR MEBANE ONLY): Streptococcus, Group A Screen (Direct): NEGATIVE

## 2014-01-21 NOTE — ED Provider Notes (Signed)
CSN: [id number]     Arrival date & time 01/21/14  1923 History   First MD Initiated Contact with Patient 01/21/14 2049     Chief Complaint  Patient presents with  . URI     (Consider location/radiation/quality/duration/timing/severity/associated sxs/prior Treatment) The history is provided by the patient.   Rachel Parsons is a 33 y.o. female presenting with a 2 week history of pharyngitis.  She describes symptoms starting over giving, describing sudden onset of fevers and chills along with sore throat.  Additionally she has had a cough which has been occasionally productive of clear to yellow sputum which is now improved.  She has soreness with swallowing but has had no further fevers since Thanksgiving weekend.  Her 3 children are also here with similar complaints and she is concerned about possibility of strep throat infection as she continues to have swollen tonsillar lymph nodes.  She also describes an ulcer on her posterior tongue which was very painful but is now improving.  She has taken no medications for her symptoms other than herbals and "oils".     Past Medical History  Diagnosis Date  . Palpitation   . DVT (deep venous thrombosis)     after knee surgery  . Atrial fibrillation   . History of atrial fibrillation     postural orthostatic tachycardia syndrome  . Abnormal Pap smear   . Depression   . Anxiety    Past Surgical History  Procedure Laterality Date  . Childbirth      times 5  . Tonsillectomy    . Knee arthrocentesis    . Knee arthroplasty      right  . Cardioversion  May 2012    pt. states heart spontaneously converted before cardioversion  . Laparoscopic tubal ligation  11/18/2010    Procedure: LAPAROSCOPIC TUBAL LIGATION;  Surgeon: Lazaro ArmsLuther H Eure, MD;  Location: [facility];  Service: Gynecology;  Laterality: Bilateral;  . Umbilical hernia repair  11/18/2010    Procedure: HERNIA REPAIR UMBILICAL ADULT;  Surgeon: Lazaro ArmsLuther H Eure, MD;  Location: [facility];  Service:  Gynecology;  Laterality: N/A;   Family History  Problem Relation Age of Onset  . Cancer Maternal Grandmother     brain  . Cancer Maternal Grandfather     throat  . Anesthesia problems Neg Hx   . Hypotension Neg Hx   . Malignant hyperthermia Neg Hx   . Pseudochol deficiency Neg Hx   . Cancer Maternal Aunt     lung and bone   . ALS Paternal Grandmother   . Diabetes Paternal Grandfather    History  Substance Use Topics  . Smoking status: Never Smoker   . Smokeless tobacco: Never Used  . Alcohol Use: No   OB History    Gravida Para Term Preterm AB TAB SAB Ectopic Multiple Living   8 6 6  2 1 1   6      Review of Systems  Constitutional: Positive for fever and chills.  HENT: Positive for rhinorrhea and sore throat. Negative for congestion, ear pain and trouble swallowing.   Eyes: Negative.   Respiratory: Positive for cough. Negative for chest tightness and shortness of breath.   Cardiovascular: Negative for chest pain.  Gastrointestinal: Negative for nausea and abdominal pain.  Genitourinary: Negative.   Musculoskeletal: Negative for joint swelling, arthralgias and neck pain.  Skin: Negative.  Negative for rash and wound.  Neurological: Negative for dizziness, weakness, light-headedness, numbness and headaches.  Psychiatric/Behavioral: Negative.  Allergies  Erythromycin and Sulfa antibiotics  Home Medications   Prior to Admission medications   Medication Sig Start Date End Date Taking? Authorizing Provider  sertraline (ZOLOFT) 50 MG tablet Take 75 mg by mouth daily. 11/21/13  Yes Historical Provider, MD   BP 108/62 mmHg  Pulse 74  Temp(Src) 98.4 F (36.9 C) (Oral)  Resp 20  Ht 5\' 3"  (1.6 m)  Wt 142 lb (64.411 kg)  BMI 25.16 kg/m2  SpO2 99%  LMP 01/10/2014 Physical Exam  Constitutional: She is oriented to person, place, and time. She appears well-developed and well-nourished.  HENT:  Head: Normocephalic and atraumatic.  Right Ear: Tympanic membrane and  ear canal normal.  Left Ear: Tympanic membrane and ear canal normal.  Nose: Mucosal edema and rhinorrhea present.  Mouth/Throat: Uvula is midline, oropharynx is clear and moist and mucous membranes are normal. No oropharyngeal exudate, posterior oropharyngeal edema, posterior oropharyngeal erythema or tonsillar abscesses.  Eyes: Conjunctivae are normal.  Cardiovascular: Normal rate and normal heart sounds.   Pulmonary/Chest: Effort normal. No respiratory distress. She has no wheezes. She has no rales.  Abdominal: Soft. There is no tenderness.  Musculoskeletal: Normal range of motion.  Lymphadenopathy:       Head (right side): Tonsillar adenopathy present.       Head (left side): Tonsillar adenopathy present.  Mild bilateral tonsillar hypertrophy and tenderness.  Neurological: She is alert and oriented to person, place, and time.  Skin: Skin is warm and dry. No rash noted.  Psychiatric: She has a normal mood and affect.    ED Course  Procedures (including critical care time) Labs Review Labs Reviewed  RAPID STREP SCREEN  CULTURE, GROUP A STREP    Imaging Review No results found.   EKG Interpretation None      MDM   Final diagnoses:  Viral pharyngitis    Patients labs and/or radiological studies were viewed and considered during the medical decision making and disposition process. Encouraged rest, increased fluid intake, motrin prn for throat pain.  F/u with pcp prn persistent sx.  The patient appears reasonably screened and/or stabilized for discharge and I doubt any other medical condition or other [facility] requiring further screening, evaluation, or treatment in the ED at this time prior to discharge.     Burgess AmorJulie Jilene Spohr, PA-C 01/22/14 [id number]  Vida RollerBrian D Miller, MD 01/22/14 [id number]

## 2014-01-21 NOTE — ED Notes (Signed)
Pt c/o sore throat, fever and cough x 2 1/2 weeks with no relief

## 2014-01-21 NOTE — Discharge Instructions (Signed)

## 2014-01-23 LAB — CULTURE, GROUP A STREP

## 2014-05-14 ENCOUNTER — Emergency Department (HOSPITAL_COMMUNITY)
Admission: EM | Admit: 2014-05-14 | Discharge: 2014-05-14 | Disposition: A | Discharge status: Home / Self Care | Payer: Self-pay | Attending: Emergency Medicine | Admitting: Emergency Medicine

## 2014-05-14 ENCOUNTER — Encounter (HOSPITAL_COMMUNITY): Payer: Self-pay | Admitting: Emergency Medicine

## 2014-05-14 ENCOUNTER — Emergency Department (HOSPITAL_COMMUNITY): Payer: Self-pay

## 2014-05-14 DIAGNOSIS — Z86718 Personal history of other venous thrombosis and embolism: Secondary | ICD-10-CM | POA: Insufficient documentation

## 2014-05-14 DIAGNOSIS — Z79899 Other long term (current) drug therapy: Secondary | ICD-10-CM | POA: Insufficient documentation

## 2014-05-14 DIAGNOSIS — F329 Major depressive disorder, single episode, unspecified: Secondary | ICD-10-CM | POA: Insufficient documentation

## 2014-05-14 DIAGNOSIS — N200 Calculus of kidney: Secondary | ICD-10-CM | POA: Insufficient documentation

## 2014-05-14 DIAGNOSIS — F419 Anxiety disorder, unspecified: Secondary | ICD-10-CM | POA: Insufficient documentation

## 2014-05-14 DIAGNOSIS — R52 Pain, unspecified: Secondary | ICD-10-CM

## 2014-05-14 DIAGNOSIS — Z3202 Encounter for pregnancy test, result negative: Secondary | ICD-10-CM | POA: Insufficient documentation

## 2014-05-14 DIAGNOSIS — Z8679 Personal history of other diseases of the circulatory system: Secondary | ICD-10-CM | POA: Insufficient documentation

## 2014-05-14 LAB — URINALYSIS, ROUTINE W REFLEX MICROSCOPIC
GLUCOSE, UA: NEGATIVE mg/dL
KETONES UR: 15 mg/dL — AB
Leukocytes, UA: NEGATIVE
Nitrite: NEGATIVE
Protein, ur: 30 mg/dL — AB
Specific Gravity, Urine: 1.039 — ABNORMAL HIGH (ref 1.005–1.030)
Urobilinogen, UA: 0.2 mg/dL (ref 0.0–1.0)
pH: 6 (ref 5.0–8.0)

## 2014-05-14 LAB — COMPREHENSIVE METABOLIC PANEL
ALBUMIN: 4.1 g/dL (ref 3.5–5.2)
ALK PHOS: 56 U/L (ref 39–117)
ALT: 14 U/L (ref 0–35)
AST: 21 U/L (ref 0–37)
Anion gap: 7 (ref 5–15)
BUN: 13 mg/dL (ref 6–23)
CALCIUM: 9.2 mg/dL (ref 8.4–10.5)
CO2: 26 mmol/L (ref 19–32)
Chloride: 106 mmol/L (ref 96–112)
Creatinine, Ser: 0.88 mg/dL (ref 0.50–1.10)
GFR calc non Af Amer: 85 mL/min — ABNORMAL LOW (ref >90)
Glucose, Bld: 122 mg/dL — ABNORMAL HIGH (ref 70–99)
POTASSIUM: 3.6 mmol/L (ref 3.5–5.1)
Sodium: 139 mmol/L (ref 135–145)
Total Bilirubin: 0.5 mg/dL (ref 0.3–1.2)
Total Protein: 6.2 g/dL (ref 6.0–8.3)

## 2014-05-14 LAB — CBC WITH DIFFERENTIAL/PLATELET
BASOS ABS: 0 10*3/uL (ref 0.0–0.1)
BASOS PCT: 0 % (ref 0–1)
EOS ABS: 0.2 10*3/uL (ref 0.0–0.7)
EOS PCT: 2 % (ref 0–5)
HEMATOCRIT: 40.6 % (ref 36.0–46.0)
Hemoglobin: 13.4 g/dL (ref 12.0–15.0)
LYMPHS ABS: 1.8 10*3/uL (ref 0.7–4.0)
LYMPHS PCT: 25 % (ref 12–46)
MCH: 28.9 pg (ref 26.0–34.0)
MCHC: 33 g/dL (ref 30.0–36.0)
MCV: 87.5 fL (ref 78.0–100.0)
MONOS PCT: 6 % (ref 3–12)
Monocytes Absolute: 0.4 10*3/uL (ref 0.1–1.0)
NEUTROS ABS: 4.9 10*3/uL (ref 1.7–7.7)
Neutrophils Relative %: 67 % (ref 43–77)
PLATELETS: 274 10*3/uL (ref 150–400)
RBC: 4.64 MIL/uL (ref 3.87–5.11)
RDW: 13.5 % (ref 11.5–15.5)
WBC: 7.2 10*3/uL (ref 4.0–10.5)

## 2014-05-14 LAB — URINE MICROSCOPIC-ADD ON

## 2014-05-14 LAB — POC URINE PREG, ED: Preg Test, Ur: NEGATIVE

## 2014-05-14 MED ORDER — KETOROLAC TROMETHAMINE 30 MG/ML IJ SOLN
30.0000 mg | Freq: Once | INTRAMUSCULAR | Status: AC
  Administered 2014-05-14: 30 mg via INTRAVENOUS
  Filled 2014-05-14: qty 1

## 2014-05-14 MED ORDER — ONDANSETRON HCL 4 MG/2ML IJ SOLN
4.0000 mg | Freq: Once | INTRAMUSCULAR | Status: AC
  Administered 2014-05-14: 4 mg via INTRAVENOUS
  Filled 2014-05-14: qty 2

## 2014-05-14 MED ORDER — HYDROCODONE-ACETAMINOPHEN 5-325 MG PO TABS: 1.0000 | ORAL_TABLET | Freq: Four times a day (QID) | ORAL | Status: AC | PRN

## 2014-05-14 MED ORDER — TAMSULOSIN HCL 0.4 MG PO CAPS: 0.4000 mg | ORAL_CAPSULE | Freq: Every day | ORAL | Status: AC

## 2014-05-14 MED ORDER — SODIUM CHLORIDE 0.9 % IV BOLUS (SEPSIS)
500.0000 mL | Freq: Once | INTRAVENOUS | Status: AC
  Administered 2014-05-14: 500 mL via INTRAVENOUS

## 2014-05-14 MED ORDER — OXYCODONE-ACETAMINOPHEN 5-325 MG PO TABS: 1.0000 | ORAL_TABLET | ORAL | Status: AC | PRN

## 2014-05-14 MED ORDER — HYDROMORPHONE HCL 1 MG/ML IJ SOLN
1.0000 mg | Freq: Once | INTRAMUSCULAR | Status: AC
  Administered 2014-05-14: 1 mg via INTRAVENOUS
  Filled 2014-05-14: qty 1

## 2014-05-14 MED ORDER — HYDROMORPHONE HCL 1 MG/ML IJ SOLN
1.0000 mg | Freq: Once | INTRAMUSCULAR | Status: AC
Start: 2014-05-14 — End: 2014-05-14
  Administered 2014-05-14: 1 mg via INTRAVENOUS
  Filled 2014-05-14: qty 1

## 2014-05-14 MED ORDER — ONDANSETRON 4 MG PO TBDP: ORAL_TABLET | ORAL | Status: AC

## 2014-05-14 NOTE — ED Notes (Signed)
Patient transported to CT 

## 2014-05-14 NOTE — ED Notes (Signed)
Pt given mouth swab.

## 2014-05-14 NOTE — ED Notes (Signed)
PT is being transported to CT.

## 2014-05-14 NOTE — Discharge Instructions (Signed)
Follow up with [facility].  Return if any problems,  But for kidney [facility]

## 2014-05-14 NOTE — ED Notes (Addendum)
Pt from home for eval of sudden back pain that began at 0645 this morning, pt denies any dysuria or n/v/d or injury to back. Pt also denies any abdominal pain but reports increase in bleeding with periods and reports passing multiple clots. nad noted at this time.

## 2014-05-14 NOTE — ED Notes (Signed)
Pt is back from CT

## 2014-05-14 NOTE — ED Provider Notes (Signed)
CSN: [id number]     Arrival date & time 05/14/14  4098 History   First MD Initiated Contact with Patient 05/14/14 [phone]     Chief Complaint  Patient presents with  . Back Pain     (Consider location/radiation/quality/duration/timing/severity/associated sxs/prior Treatment) Patient is a 34 y.o. female presenting with flank pain. The history is provided by the patient (the pt complains of left side flank pain.  started this am).  Flank Pain This is a new problem. The current episode started 1 to 2 hours ago. The problem occurs constantly. The problem has not changed since onset.Pertinent negatives include no chest pain, no abdominal pain and no headaches. Nothing aggravates the symptoms. Nothing relieves the symptoms.    Past Medical History  Diagnosis Date  . Palpitation   . DVT (deep venous thrombosis)     after knee surgery  . Atrial fibrillation   . History of atrial fibrillation     postural orthostatic tachycardia syndrome  . Abnormal Pap smear   . Depression   . Anxiety    Past Surgical History  Procedure Laterality Date  . Childbirth      times 5  . Tonsillectomy    . Knee arthrocentesis    . Knee arthroplasty      right  . Cardioversion  May 2012    pt. states heart spontaneously converted before cardioversion  . Laparoscopic tubal ligation  11/18/2010    Procedure: LAPAROSCOPIC TUBAL LIGATION;  Surgeon: Lazaro Arms, MD;  Location: [facility];  Service: Gynecology;  Laterality: Bilateral;  . Umbilical hernia repair  11/18/2010    Procedure: HERNIA REPAIR UMBILICAL ADULT;  Surgeon: Lazaro Arms, MD;  Location: [facility];  Service: Gynecology;  Laterality: N/A;   Family History  Problem Relation Age of Onset  . Cancer Maternal Grandmother     brain  . Cancer Maternal Grandfather     throat  . Anesthesia problems Neg Hx   . Hypotension Neg Hx   . Malignant hyperthermia Neg Hx   . Pseudochol deficiency Neg Hx   . Cancer Maternal Aunt     lung and bone   . ALS  Paternal Grandmother   . Diabetes Paternal Grandfather    History  Substance Use Topics  . Smoking status: Never Smoker   . Smokeless tobacco: Never Used  . Alcohol Use: No   OB History    Gravida Para Term Preterm AB TAB SAB Ectopic Multiple Living   Review of Systems  Constitutional: Negative for appetite change and fatigue.  HENT: Negative for congestion, ear discharge and sinus pressure.   Eyes: Negative for discharge.  Respiratory: Negative for cough.   Cardiovascular: Negative for chest pain.  Gastrointestinal: Negative for abdominal pain and diarrhea.  Genitourinary: Positive for flank pain. Negative for frequency and hematuria.  Musculoskeletal: Negative for back pain.  Skin: Negative for rash.  Neurological: Negative for seizures and headaches.  Psychiatric/Behavioral: Negative for hallucinations.      Allergies  Erythromycin and Sulfa antibiotics  Home Medications   Prior to Admission medications   Medication Sig Start Date End Date Taking? Authorizing Provider  acetaminophen (TYLENOL) 500 MG tablet Take 1,000 mg by mouth every 6 (six) hours as needed.   Yes Historical Provider, MD  Liniments (DEEP BLUE RELIEF EX) Apply 1 application topically daily as needed (body ache).   Yes Historical Provider, MD  OVER THE COUNTER MEDICATION  Take 1 capsule by mouth daily. Essential oil capsule   Yes Historical Provider, MD  Probiotic Product (PROBIOTIC PO) Take 1 capsule by mouth daily. Probiotic assist   Yes Historical Provider, MD  sertraline (ZOLOFT) 100 MG tablet Take 100 mg by mouth daily.   Yes Historical Provider, MD  ondansetron (ZOFRAN ODT) 4 MG disintegrating tablet  ODT q4 hours prn nausea/vomit 05/14/14   Bethann Berkshire, MD  oxyCODONE-acetaminophen (PERCOCET) 5-325 MG per tablet Take 1 tablet by mouth every 4 (four) hours as needed for moderate pain. 05/14/14   Bethann Berkshire, MD  tamsulosin (FLOMAX) 0.4 MG CAPS capsule Take 1 capsule (0.4 mg  total) by mouth daily. 05/14/14   Bethann Berkshire, MD   BP 111/52 mmHg  Pulse 73  Temp(Src) 98.3 F (36.8 C) (Oral)  Resp 14  Ht  (1.6 m)  Wt 138 lb (62.596 kg)  BMI 24.45 kg/m2  SpO2 98%  LMP 05/02/2014 (Exact Date) Physical Exam  Constitutional: She is oriented to person, place, and time. She appears well-developed.  HENT:  Head: Normocephalic.  Eyes: Conjunctivae and EOM are normal. No scleral icterus.  Neck: Neck supple. No thyromegaly present.  Cardiovascular: Normal rate and regular rhythm.  Exam reveals no gallop and no friction rub.   No murmur heard. Pulmonary/Chest: No stridor. She has no wheezes. She has no rales. She exhibits no tenderness.  Abdominal: She exhibits no distension. There is no tenderness. There is no rebound.  Genitourinary:  Tenderness moderate.  Left flank  Musculoskeletal: Normal range of motion. She exhibits no edema.  Lymphadenopathy:    She has no cervical adenopathy.  Neurological: She is oriented to person, place, and time. She exhibits normal muscle tone. Coordination normal.  Skin: No rash noted. No erythema.  Psychiatric: She has a normal mood and affect. Her behavior is normal.    ED Course  Procedures (including critical care time) Labs Review Labs Reviewed  COMPREHENSIVE METABOLIC PANEL - Abnormal; Notable for the following:    Glucose, Bld 122 (*)    GFR calc non Af Amer 85 (*)    All other components within normal limits  URINALYSIS, ROUTINE W REFLEX MICROSCOPIC - Abnormal; Notable for the following:    Color, Urine AMBER (*)    APPearance CLOUDY (*)    Specific Gravity, Urine 1.039 (*)    Hgb urine dipstick LARGE (*)    Bilirubin Urine SMALL (*)    Ketones, ur 15 (*)    Protein, ur 30 (*)    All other components within normal limits  URINE MICROSCOPIC-ADD ON - Abnormal; Notable for the following:    Squamous Epithelial / LPF MANY (*)    Bacteria, UA FEW (*)    All other components within normal limits  CBC WITH  DIFFERENTIAL/PLATELET  POC URINE PREG, ED    Imaging Review Ct Renal Stone Study  05/14/2014   ADDENDUM REPORT: 05/14/2014 12:34  ADDENDUM: Comment: The nodular opacity in the left base is stable compared to an earlier chest CT July 24, 2010. Given stability of this nodular lesion for greater than 3 years, no further evaluation is felt to be warranted from an imaging standpoint.   Electronically Signed   By: Bretta Bang III M.D.   On: 05/14/2014 12:34   05/14/2014   CLINICAL DATA:  Acute onset flank pain  EXAM: CT ABDOMEN AND PELVIS WITHOUT CONTRAST  TECHNIQUE: Multidetector CT imaging of the abdomen and pelvis was performed following the standard protocol without oral or  intravenous contrast material administration.  COMPARISON:  None.  FINDINGS: On the initial CT slice, there is a 6 mm nodular opacity in the anterior segment of the left lower lobe. There is mild scarring in the visualized lung bases.  Liver is prominent, measuring 18.2 cm in length. No focal liver lesions are identified on this noncontrast enhanced study. Gallbladder wall is not thickened. There is no appreciable biliary duct dilatation.  No splenic lesions are appreciated. Pancreas and adrenals appear normal. There is a cyst in the lower pole of the right kidney measuring 2.1 x 1.3 cm in size. There is no hydronephrosis on the right. On axial slice 61 series 201, there is a 2 mm calcification which is either within or immediately adjacent to the right ureter. It is difficult to ascertain whether this calcification is within or immediately adjacent to the ureter even on coronal and sagittal reformatted images. There is no other potential calculus in the right ureter.  On the left, there is moderate hydronephrosis. Left kidney is mildly edematous. There is no left renal mass or intrarenal calculus. There is a 2 mm calculus at the left ureterovesical junction. There are phleboliths in the left pelvis which are separate from the ureter.   In the pelvis, the urinary bladder is decompressed. There is no pelvic mass. There is rather minimal free fluid in the cul-de-sac region. The appendix appears normal.  There is no bowel obstruction. No free air or portal venous air. There is no adenopathy or abscess in the an or pelvis. There is no abdominal aortic aneurysm. No blastic or lytic bone lesions.  IMPRESSION: 2 mm calculus left ureterovesical junction with moderate left-sided hydronephrosis and mild renal edema.  On the right, there is no hydronephrosis. There is a 2 mm calcification in the mid right pelvis which is either within or immediately adjacent to the right ureter.  Rather minimal free fluid in the cul-de-sac may be physiologic.  No bowel obstruction.  No abscess.  Appendix appears normal.  Liver prominent without focal lesion.  6 mm nodular opacity anterior left lung base. Followup of this nodular opacity should be based on Fleischner Society guidelines. If the patient is at high risk for bronchogenic carcinoma, follow-up chest CT at 6-12 months is recommended. If the patient is at low risk for bronchogenic carcinoma, follow-up chest CT at 12 months is recommended. This recommendation follows the consensus statement: Guidelines for Management of Small Pulmonary Nodules Detected on CT Scans: A Statement from the Fleischner Society as published in Radiology 2005;237:395-400.  Electronically Signed: By: Bretta BangWilliam  Woodruff III M.D. On: 05/14/2014 11:34     EKG Interpretation None      MDM   Final diagnoses:  Pain  Kidney stone on left side    Kidney stone,  tx with percocet, zofran and flomax    Bethann BerkshireJoseph Favor Kreh, MD 05/14/14 1244

## 2016-08-31 ENCOUNTER — Encounter: Payer: Self-pay | Admitting: Gastroenterology

## 2016-08-31 ENCOUNTER — Other Ambulatory Visit: Payer: Self-pay | Admitting: Cardiology

## 2016-08-31 ENCOUNTER — Encounter: Payer: Self-pay | Admitting: Emergency Medicine

## 2016-08-31 ENCOUNTER — Emergency Department: Payer: Auto Insurance (includes no fault)

## 2016-08-31 ENCOUNTER — Observation Stay
Admission: EM | Admit: 2016-08-31 | Discharge: 2016-09-01 | Disposition: A | Discharge status: Home / Self Care | Payer: Auto Insurance (includes no fault) | Source: Ambulatory Visit | Attending: Emergency Medicine | Admitting: Emergency Medicine

## 2016-08-31 DIAGNOSIS — M25562 Pain in left knee: Secondary | ICD-10-CM

## 2016-08-31 DIAGNOSIS — R42 Dizziness and giddiness: Secondary | ICD-10-CM

## 2016-08-31 DIAGNOSIS — R413 Other amnesia: Secondary | ICD-10-CM

## 2016-08-31 DIAGNOSIS — Y998 Other external cause status: Secondary | ICD-10-CM | POA: Insufficient documentation

## 2016-08-31 DIAGNOSIS — I82409 Acute embolism and thrombosis of unspecified deep veins of unspecified lower extremity: Secondary | ICD-10-CM | POA: Insufficient documentation

## 2016-08-31 DIAGNOSIS — Y92410 Unspecified street and highway as the place of occurrence of the external cause: Secondary | ICD-10-CM

## 2016-08-31 DIAGNOSIS — I4891 Unspecified atrial fibrillation: Secondary | ICD-10-CM | POA: Insufficient documentation

## 2016-08-31 DIAGNOSIS — I499 Cardiac arrhythmia, unspecified: Secondary | ICD-10-CM

## 2016-08-31 DIAGNOSIS — M25512 Pain in left shoulder: Secondary | ICD-10-CM

## 2016-08-31 DIAGNOSIS — R55 Syncope and collapse: Principal | ICD-10-CM | POA: Insufficient documentation

## 2016-08-31 DIAGNOSIS — Y9289 Other specified places as the place of occurrence of the external cause: Secondary | ICD-10-CM | POA: Insufficient documentation

## 2016-08-31 DIAGNOSIS — Y9389 Activity, other specified: Secondary | ICD-10-CM | POA: Insufficient documentation

## 2016-08-31 HISTORY — DX: Unspecified atrial fibrillation: I48.91

## 2016-08-31 LAB — ETHANOL: Ethanol: <10 mg/dL (ref 0–9)

## 2016-08-31 LAB — PHOSPHORUS: Phosphorus: 3 mg/dL (ref 2.7–4.5)

## 2016-08-31 LAB — CBC AND DIFFERENTIAL
Baso # K/uL: 0 10*3/uL (ref 0.0–0.1)
Basophil %: 0.3 %
Eos # K/uL: 0.2 10*3/uL (ref 0.0–0.4)
Eosinophil %: 1.5 %
Hematocrit: 40 % (ref 34–45)
Hemoglobin: 12.8 g/dL (ref 11.2–15.7)
IMM Granulocytes #: 0 10*3/uL (ref 0.0–0.1)
IMM Granulocytes: 0.3 %
Lymph # K/uL: 2.1 10*3/uL (ref 1.2–3.7)
Lymphocyte %: 21.6 %
MCH: 28 pg/cell (ref 26–32)
MCHC: 32 g/dL (ref 32–36)
MCV: 88 fL (ref 79–95)
Mono # K/uL: 0.6 10*3/uL (ref 0.2–0.9)
Monocyte %: 6.4 %
Neut # K/uL: 6.8 10*3/uL — ABNORMAL HIGH (ref 1.6–6.1)
Nucl RBC # K/uL: 0 10*3/uL (ref 0.0–0.0)
Nucl RBC %: 0 /100 WBC (ref 0.0–0.2)
Platelets: 309 10*3/uL (ref 160–370)
RBC: 4.6 MIL/uL (ref 3.9–5.2)
RDW: 14.6 % — ABNORMAL HIGH (ref 11.7–14.4)
Seg Neut %: 69.9 %
WBC: 9.7 10*3/uL (ref 4.0–10.0)

## 2016-08-31 LAB — PLASMA PROF 7 (ED ONLY)
Anion Gap,PL: 15 (ref 7–16)
CO2,Plasma: 22 mmol/L (ref 20–28)
Chloride,Plasma: 105 mmol/L (ref 96–108)
Creatinine: 0.63 mg/dL (ref 0.51–0.95)
GFR,Black: 133 *
GFR,Caucasian: 115 *
Glucose,Plasma: 86 mg/dL (ref 60–99)
Potassium,Plasma: 3.5 mmol/L (ref 3.4–4.7)
Sodium,Plasma: 142 mmol/L (ref 133–145)
UN,Plasma: 14 mg/dL (ref 6–20)

## 2016-08-31 LAB — HOLD BLUE

## 2016-08-31 LAB — PREGNANCY TEST, SERUM: Preg,Serum: NEGATIVE

## 2016-08-31 LAB — HOLD LAVENDER

## 2016-08-31 LAB — HM HIV SCREENING OFFERED

## 2016-08-31 LAB — HOLD SST

## 2016-08-31 LAB — MAGNESIUM: Magnesium: 1.8 mEq/L (ref 1.3–2.1)

## 2016-08-31 LAB — HOLD GREEN WITH GEL

## 2016-08-31 MED ORDER — SODIUM CHLORIDE 0.9 % IV BOLUS *I*
1000.0000 mL | Freq: Once | Status: AC
Start: 2016-08-31 — End: 2016-08-31

## 2016-08-31 MED ORDER — KETOROLAC TROMETHAMINE 30 MG/ML IJ SOLN *I*
15.0000 mg | Freq: Four times a day (QID) | INTRAMUSCULAR | Status: DC | PRN
Start: 2016-09-01 — End: 2016-09-01
  Administered 2016-09-01: 15 mg via INTRAVENOUS
  Filled 2016-08-31: qty 1

## 2016-08-31 MED ORDER — ACETAMINOPHEN 325 MG PO TABS *I*
650.0000 mg | ORAL_TABLET | Freq: Once | ORAL | Status: AC
Start: 2016-08-31 — End: 2016-08-31
  Administered 2016-08-31: 650 mg via ORAL
  Filled 2016-08-31: qty 2

## 2016-08-31 MED ORDER — KETOROLAC TROMETHAMINE 30 MG/ML IJ SOLN *I*
15.0000 mg | Freq: Once | INTRAMUSCULAR | Status: AC
Start: 2016-08-31 — End: 2016-08-31
  Administered 2016-08-31: 15 mg via INTRAVENOUS
  Filled 2016-08-31: qty 1

## 2016-08-31 MED ORDER — LIDOCAINE 5 % EX PTCH *I*
1.0000 | MEDICATED_PATCH | CUTANEOUS | Status: DC
Start: 2016-08-31 — End: 2016-09-01
  Administered 2016-08-31: 1 via TRANSDERMAL
  Filled 2016-08-31 (×2): qty 1

## 2016-08-31 MED ORDER — ACETAMINOPHEN 325 MG PO TABS *I*
650.0000 mg | ORAL_TABLET | Freq: Four times a day (QID) | ORAL | Status: DC | PRN
Start: 2016-09-01 — End: 2016-09-01
  Filled 2016-08-31: qty 2
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
## 2016-08-31 NOTE — ED Notes (Signed)
Plan of Care     Pain management  Telemetry monitoring  VS q4hrs  [facility]  Comfort measures as needed

## 2016-08-31 NOTE — ED Notes (Signed)
Pt arrived to unit via stretcher with husband at bedside and IVF infusing. IV bolus stopped. Pt was able to ambulate to bed and bathroom with a slow and steady gait. Tele placed. Pt complained of 5/10 left shoulder, bilateral knee pain, but increases to 8/10 with movement. Pt states she feels intense sharp pain with movements. Pt denies SOB, nausea and dizziness. Writer oriented pt to unit and room. Plan of care discussed. Call bell and bedside table within reach. Will continue to monitor. Provider HE at bedside.

## 2016-08-31 NOTE — ED Notes (Signed)
Ice pack placed between bilateral knees.
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
## 2016-09-01 ENCOUNTER — Ambulatory Visit: Payer: Medicaid (Managed Care)

## 2016-09-01 DIAGNOSIS — R55 Syncope and collapse: Secondary | ICD-10-CM

## 2016-09-01 DIAGNOSIS — M542 Cervicalgia: Secondary | ICD-10-CM

## 2016-09-01 DIAGNOSIS — R079 Chest pain, unspecified: Secondary | ICD-10-CM

## 2016-09-01 DIAGNOSIS — R51 Headache: Secondary | ICD-10-CM

## 2016-09-01 LAB — EKG 12-LEAD
P: 16 deg
PR: 140 ms
QRS: 28 deg
QRSD: 90 ms
QT: 356 ms
QTc: 441 ms
Rate: 92 {beats}/min
Severity: NORMAL
T: 33 deg

## 2016-09-01 MED ORDER — ONDANSETRON 4 MG PO TBDP *I*
4.0000 mg | ORAL_TABLET | Freq: Once | ORAL | Status: DC
Start: 2016-09-01 — End: 2016-09-01
  Filled 2016-09-01: qty 1

## 2016-09-01 MED ORDER — OXYCODONE HCL 5 MG PO TABS *I*
5.0000 mg | ORAL_TABLET | Freq: Once | ORAL | Status: DC
Start: 2016-09-01 — End: 2016-09-01
  Filled 2016-09-01: qty 1

## 2016-09-01 MED ORDER — MORPHINE SULFATE 4 MG/ML IV SOLN *WRAPPED*
4.0000 mg | Freq: Once | INTRAVENOUS | Status: AC
Start: 2016-09-01 — End: 2016-09-01
  Administered 2016-09-01: 4 mg via INTRAVENOUS
  Filled 2016-09-01: qty 1

## 2016-09-01 MED ORDER — LIDOCAINE 5 % EX PTCH *I*
1.0000 | MEDICATED_PATCH | CUTANEOUS | 0 refills | Status: AC
Start: 2016-09-01 — End: 2016-10-01

## 2016-09-01 MED ORDER — HYDROCODONE-ACETAMINOPHEN 5-325 MG PO TABS *I*
1.0000 | ORAL_TABLET | Freq: Four times a day (QID) | ORAL | 0 refills | Status: DC | PRN
Start: 2016-09-01 — End: 2016-10-13

## 2016-09-01 MED ORDER — CYCLOBENZAPRINE HCL 10 MG PO TABS *I*
5.0000 mg | ORAL_TABLET | Freq: Three times a day (TID) | ORAL | Status: DC | PRN
Start: 2016-09-01 — End: 2016-09-01
  Administered 2016-09-01: 5 mg via ORAL
  Filled 2016-09-01: qty 1

## 2016-09-01 NOTE — Progress Notes (Signed)
09/01/16 1340   [facility] Patient Class Review   Patient Class Review Observation     Patient Class Effective as of 08/31/16    Kirby FunkJohn Bj Morlock, RN  Pager: [phone]

## 2016-09-01 NOTE — ED Notes (Signed)
Assumed care of pt at 0800, assessed at this time. Pt A&Ox4, pleasant. Reports generalized aches and pains including bilateral knees, neck, back. Pt given pain medication per MAR. Reviewed POC, voiced understanding.
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
## 2016-09-01 NOTE — ED Notes (Signed)
Pt appears to be sleeping with RR 16. Will continue to monitor

## 2016-09-10 LAB — HOLTER MONITOR - 48 HOUR
AF Count: 0
AIVR/IVR Runs: 0
Analysis Time: 0:0 {titer}
Analyze Time Length Hours: 48
Analyze Time Length Minutes: 24
Bigeminy: 20
Bradycardia Runs: 0
Couplet: 5
Max Heart Rate: 146
Mean Heart Rate: 78
Min Heart Rate: 56
Pauses: 0
R on T: 0
SVE Max Per Hour: 0
SVE Percent Beats: 0 %
SVE Total Beats: 0
SVT Runs: 0
Tachycardia Runs: 0
Total Beats: 222900
Trigeminy: 21
Triplet: 0
VE Max Per Hour Time: 15:0 {titer}
VE Max Per Hour: 86
VE Percent Beats: 0.3 %
VE Total Beats: 666
VT Runs: 1
VT longest run: 22
VT max rate: 139 {beats}/min

## 2016-09-13 LAB — EKG 12-LEAD
P: 56 deg
PR: 148 ms
QRS: 53 deg
QRSD: 96 ms
QT: 352 ms
QTc: 447 ms
Rate: 97 {beats}/min
Severity: NORMAL
T: 67 deg

## 2016-09-14 ENCOUNTER — Telehealth: Payer: Self-pay
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
## 2016-09-17 ENCOUNTER — Other Ambulatory Visit: Payer: Self-pay | Admitting: Cardiology

## 2016-09-17 ENCOUNTER — Encounter: Payer: Self-pay | Admitting: Cardiovascular Disease

## 2016-09-17 ENCOUNTER — Ambulatory Visit
Admission: RE | Admit: 2016-09-17 | Discharge: 2016-09-17 | Disposition: A | Discharge status: Home / Self Care | Payer: Medicaid (Managed Care) | Source: Ambulatory Visit | Attending: Cardiology | Admitting: Cardiology

## 2016-09-17 ENCOUNTER — Encounter: Payer: Self-pay | Admitting: Gastroenterology

## 2016-09-17 ENCOUNTER — Ambulatory Visit: Payer: Medicaid (Managed Care) | Admitting: Cardiovascular Disease

## 2016-09-17 VITALS — BP 120/73 | HR 67 | Temp 97.5°F | Resp 16 | Ht 63.0 in | Wt 159.4 lb

## 2016-09-17 DIAGNOSIS — I472 Ventricular tachycardia, unspecified: Secondary | ICD-10-CM

## 2016-09-17 DIAGNOSIS — Y9289 Other specified places as the place of occurrence of the external cause: Secondary | ICD-10-CM | POA: Insufficient documentation

## 2016-09-17 DIAGNOSIS — I480 Paroxysmal atrial fibrillation: Secondary | ICD-10-CM | POA: Insufficient documentation

## 2016-09-17 LAB — ECHO COMPLETE
Aortic Diameter (mid tubular): 2.8 cm
Aortic Diameter (sinus of Valsalva): 2.8 cm
BMI: 28.3 kg/m2
BSA: 1.79 m2
Deceleration Time - MV: 150 ms
E/A ratio: 1.7
Echo RV Stroke Work Index Estimate: 651.6 mmHg•mL/m2
Heart Rate: 68 {beats}/min
Height: 62.992 in
LA Diameter BSA Index: 1.7 cm/m2
LA Diameter Height Index: 1.9 cm/m
LA Diameter: 3 cm
LA Systolic Vol BSA Index: 24 mL/m2
LA Systolic Vol Height Index: 26.9 mL/m
LA Systolic Volume: 43 mL
LV ASE Mass BSA Index: 85.4 gm/m2
LV ASE Mass Height 2.7 Index: 43 gm/m2.7
LV ASE Mass Height Index: 95.6 gm/m
LV ASE Mass: 153 gm
LV CO BSA Index: 2.77 L/min/m2
LV Cardiac Output: 4.96 L/min
LV Diastolic Volume Index: 59.7 mL/m2
LV Isovolumic Relaxation Time: 66.3 ms
LV Posterior Wall Thickness: 0.9 cm
LV SV - LVOT SV Diff: 1.36 mL
LV SV BSA Index: 40.7 mL/m2
LV SV Height Index: 45.6 mL/m
LV Septal Thickness: 0.9 cm
LV Stroke Volume: 72.9 mL
LV Systolic Volume Index: 19 mL/m2
LVED Diameter BSA Index: 2.7 cm/m2
LVED Diameter Height Index: 3.1 cm/m
LVED Diameter: 4.9 cm
LVED Volume BSA Index: 59.7 mL/m2
LVED Volume BSA Index: 60 ml/m2
LVED Volume Height Index: 66.8 mL/m
LVED Volume: 106.9 mL
LVEF (Volume): 68 %
LVES Volume BSA Index: 19 mL/m2
LVES Volume BSA Index: 19 ml/m2
LVES Volume Height Index: 21.3 mL/m
LVES Volume: 34 mL
LVOT Area (calculated): 3.59 cm2
LVOT Cardiac Index: 2.72 L/min/m2
LVOT Cardiac Output: 4.86 L/min
LVOT Diameter: 2.14 cm
LVOT PWD VTI: 19.9 cm
LVOT PWD Velocity (mean): 63.7 cm/s
LVOT PWD Velocity (peak): 90.6 cm/s
LVOT SV BSA Index: 39.97 mL/m2
LVOT SV Height Index: 44.7 mL/m
LVOT Stroke Rate (mean): 229 mL/s
LVOT Stroke Rate (peak): 325.7 mL/s
LVOT Stroke Volume: 71.54 cc
MR Regurgitant Fraction (LV SV Mtd): 0.02
MR Regurgitant Volume (LV SV Mtd): 1.4 mL
MV Peak A Velocity: 40 cm/s
MV Peak E Velocity: 68 cm/s
Mitral Annular E/Ea Vel Ratio: 6.48
Mitral Annular Ea Velocity: 10.5 cm/s
Peak Gradient - TR: 16 mmHg
Pulmonary Vascular Resistance Estimate: 3.2 mmHg
RA Pressure Estimate: 5 mmHg
RA Volume BSA Index: 27.4 mL/m2
RA Volume Height Index: 30.6 mL/m
RA Volume: 49 mL
RR Interval: 882.35 ms
RV Peak Systolic Pressure: 21 mmHg
RV Systolic L Strain (free wall): -35.3 %
RVED Diameter BSA Index: 2.1 cm/m2
RVED Diameter Height Index: 2.4 cm/m
RVED Diameter: 3.8 cm
Tricuspid Annular Systolic Plane Excursion (TAPSE): 2.08 cm
Weight (lbs): 159.39 [lb_av]
Weight: 2550.28 oz

## 2016-09-17 LAB — EKG 12-LEAD
P: 56 deg
PR: 156 ms
QRS: 52 deg
QRSD: 96 ms
QT: 402 ms
QTc: 425 ms
Rate: 67 {beats}/min
Severity: NORMAL
T: 63 deg

## 2016-09-17 MED ORDER — SODIUM CHLORIDE 0.9 % IV SOLN WRAPPED *I*
10.0000 mL | Status: DC
Start: 2016-09-17 — End: 2016-09-18

## 2016-09-17 MED ORDER — METOPROLOL TARTRATE 25 MG PO TABS *I*
12.5000 mg | ORAL_TABLET | Freq: Two times a day (BID) | ORAL | 3 refills | Status: DC
Start: 2016-09-17 — End: 2016-11-17

## 2016-09-17 MED ORDER — SULFUR HEXAFLUORIDE MICROSPH 60.7-25 MG (LUMASON) IV SUSR *I*
2.0000 mL | INTRAMUSCULAR | Status: AC | PRN
Start: 2016-09-17 — End: 2016-09-17
  Administered 2016-09-17: 2 mL via INTRAVENOUS
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
## 2016-09-17 NOTE — Patient Instructions (Signed)
Ms. Corporate treasurerCorsner--    HERE IS WHAT WE DISCUSSED TODAY:     Start the metoprolol 12.5mg  twice per day (cut the pill in half)   Have your echo (heart ultrasound) done today   Make an appointment with electrophysiology (heart rhythm doctors)   Wear the LifeVest to protect yourself from ventricular tachycardia    Come back in 3 months.    Please call the clinic if you have any questions.    Take care,  Gillian ShieldsElizabeth C. Michala Deblanc, MD  Cardiology Fellow

## 2016-09-20 ENCOUNTER — Telehealth: Payer: Self-pay | Admitting: Cardiovascular Disease

## 2016-09-20 NOTE — Telephone Encounter (Signed)
Called pt to tell her the echo results and advise her that I would like to obtain a cardiac MRI to better assess cardiac size and function. Left message asking her to call me back.

## 2016-09-27 ENCOUNTER — Telehealth: Payer: Self-pay | Admitting: Cardiovascular Disease

## 2016-09-27 NOTE — Telephone Encounter (Signed)
Called pt again to discuss echo results. Would also like to order cardiac MRI to assess for other potential causes of VT-- infiltration, ARVC, etc. Left another message. I have not ordered the cMRI yet-- will do so when I am able to reach the patient.

## 2016-09-28 ENCOUNTER — Telehealth: Payer: Self-pay | Admitting: Cardiovascular Disease

## 2016-09-28 DIAGNOSIS — I472 Ventricular tachycardia, unspecified: Secondary | ICD-10-CM

## 2016-09-28 NOTE — Telephone Encounter (Signed)
Patient called back to discussed echo results. MRI ordered for further assessment of function and myocardium.

## 2016-10-12 ENCOUNTER — Telehealth: Payer: Self-pay | Admitting: Cardiovascular Disease

## 2016-10-12 NOTE — Telephone Encounter (Signed)
Received page to call pt regarding "fluttering."    Called the pt back. She notes a "weird sensation" in her chest with "fluttering." She took her pulse and it was in the mid-60s. This is in fact low for her. The fluttering is associated with a cough and abdominal upset. +Loose stool x2 today. She has had 3 such episodes today.    She is wearing the LifeVest today. However hasn't warn it for a few days because of rawness under the electrodes. She has not been taking the metoprolol for a few days due to forgetting.    We discussed options for further evaluation. I offered her an appointment with me on Friday, but she is not able to make it that day. She does have a follow-up appointment with Dr. Margo Aye on 09/05. Thus, will proceed as follows:    - Hydration today  - Upload LifeVest data to verify no tachyarrhythmias  - Monitor for any sign of infection (her report of cold chills makes me wonder about a budding viral illness)  - Try to remember to take metoprolol  - Call back if symptoms are not improving in 2 days  - Follow-up as scheduled with EP next week    Ulyses Jarred, MD 1:41 PM 10/12/2016  Fellow, Cardiovascular Disease

## 2016-10-13 ENCOUNTER — Encounter: Payer: Self-pay | Admitting: Emergency Medicine

## 2016-10-13 ENCOUNTER — Observation Stay
Admission: EM | Admit: 2016-10-13 | Discharge: 2016-10-14 | Disposition: A | Discharge status: Home / Self Care | Payer: Medicaid (Managed Care) | Source: Ambulatory Visit | Attending: Emergency Medicine | Admitting: Emergency Medicine

## 2016-10-13 ENCOUNTER — Other Ambulatory Visit: Payer: Self-pay | Admitting: Cardiology

## 2016-10-13 DIAGNOSIS — R002 Palpitations: Principal | ICD-10-CM | POA: Insufficient documentation

## 2016-10-13 DIAGNOSIS — R0602 Shortness of breath: Secondary | ICD-10-CM | POA: Insufficient documentation

## 2016-10-13 DIAGNOSIS — R05 Cough: Secondary | ICD-10-CM

## 2016-10-13 LAB — CBC AND DIFFERENTIAL
Baso # K/uL: 0 10*3/uL (ref 0.0–0.1)
Basophil %: 0.4 %
Eos # K/uL: 0.3 10*3/uL (ref 0.0–0.4)
Eosinophil %: 3.3 %
Hematocrit: 42 % (ref 34–45)
Hemoglobin: 13.2 g/dL (ref 11.2–15.7)
IMM Granulocytes #: 0 10*3/uL (ref 0.0–0.1)
IMM Granulocytes: 0.5 %
Lymph # K/uL: 1.9 10*3/uL (ref 1.2–3.7)
Lymphocyte %: 25.5 %
MCH: 29 pg/cell (ref 26–32)
MCHC: 32 g/dL (ref 32–36)
MCV: 90 fL (ref 79–95)
Mono # K/uL: 0.4 10*3/uL (ref 0.2–0.9)
Monocyte %: 5.7 %
Neut # K/uL: 4.9 10*3/uL (ref 1.6–6.1)
Nucl RBC # K/uL: 0 10*3/uL (ref 0.0–0.0)
Nucl RBC %: 0 /100 WBC (ref 0.0–0.2)
Platelets: 361 10*3/uL (ref 160–370)
RBC: 4.6 MIL/uL (ref 3.9–5.2)
RDW: 14.6 % — ABNORMAL HIGH (ref 11.7–14.4)
Seg Neut %: 64.6 %
WBC: 7.6 10*3/uL (ref 4.0–10.0)

## 2016-10-13 LAB — PREGNANCY TEST, SERUM: Preg,Serum: NEGATIVE

## 2016-10-13 LAB — PLASMA PROF 7 (ED ONLY)
Anion Gap,PL: 13 (ref 7–16)
CO2,Plasma: 25 mmol/L (ref 20–28)
Chloride,Plasma: 104 mmol/L (ref 96–108)
Creatinine: 0.76 mg/dL (ref 0.51–0.95)
GFR,Black: 116 *
GFR,Caucasian: 101 *
Glucose,Plasma: 82 mg/dL (ref 60–99)
Potassium,Plasma: 3.9 mmol/L (ref 3.4–4.7)
Sodium,Plasma: 142 mmol/L (ref 133–145)
UN,Plasma: 11 mg/dL (ref 6–20)

## 2016-10-13 LAB — HOLD SST

## 2016-10-13 LAB — HOLD BLUE

## 2016-10-13 LAB — MAGNESIUM: Magnesium: 1.7 mEq/L (ref 1.3–2.1)

## 2016-10-13 LAB — BHCG, QUANT PREGNANCY: BHCG, QUANT PREGNANCY: <1 m[IU]/mL (ref 0–1)

## 2016-10-13 LAB — TSH: TSH: 1.66 u[IU]/mL (ref 0.27–4.20)

## 2016-10-13 MED ORDER — METOPROLOL TARTRATE 12.5 MG PO CAPS *I*
12.5000 mg | ORAL_CAPSULE | Freq: Two times a day (BID) | ORAL | Status: DC
Start: 2016-10-13 — End: 2016-10-14
  Administered 2016-10-13 – 2016-10-14 (×3): 12.5 mg via ORAL
  Filled 2016-10-13 (×3): qty 1

## 2016-10-13 MED ORDER — ACETAMINOPHEN 325 MG PO TABS *I*
650.0000 mg | ORAL_TABLET | ORAL | Status: DC | PRN
Start: 2016-10-13 — End: 2016-10-14

## 2016-10-13 MED ORDER — MAGNESIUM SULFATE 2 GM/50ML IN SW IV SOLN *I*
2000.0000 mg | Freq: Once | INTRAVENOUS | Status: AC
Start: 2016-10-13 — End: 2016-10-13
  Administered 2016-10-13: 2000 mg via INTRAVENOUS
  Filled 2016-10-13: qty 50

## 2016-10-13 NOTE — ED Notes (Signed)
At approximately 1800 pt reported feeling weak and just a general "feeling of unease". Denied CP or palpitations. Pt had just eaten a baked potato with cheese and bacon from [employer]. Pt's B/P 105/82, HR 60, SPO2 98% on r/a. Pt appears tired but in NAD, currently laying in bed, husband remains at bedside. No ectopy or arrhythmias noted on tele at that time.

## 2016-10-13 NOTE — ED Notes (Signed)
10/13/16 1428   Observation Care   Observation care initiated  Yes   Patient has been verbally notified of their observation status Yes

## 2016-10-13 NOTE — ED Notes (Signed)
Patient hx of A-fib and Kirby FunkVtach presents to ED with c/o sudden onset chest pain and heart palpitations yesterday. Pt states that she felt fine this AM, but while doing laundry chest pain returned accompanied by SOB, chills, nausea, and diarrhea. Pt also endorses dry cough which started at the same time. Pt reports intermittent dizziness. Pt currently wearing a cardiac Life Vest; [facility] Pt is A+Ox4; denies HA, vomiting, fever
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
## 2016-10-13 NOTE — ED Triage Notes (Signed)
Notes chest pain and irregular heart beat since yesterday. Hx. Atrial Fibrillation and Ventricular Tachycardia.       Triage Note   Danielle Bernardaniel K Editha Bridgeforth, RN

## 2016-10-13 NOTE — ED Notes (Signed)
Pt arrived on EOU from ed via stretcher, transferred to hospital bed without difficulty. Pt A&Ox4, pleasant, appears in NAD. Reviewed POC, call light system, location of BR, voiced understanding. Call light left within reach

## 2016-10-13 NOTE — ED Provider Progress Notes (Signed)
ED Provider Progress Note    EP recommended that the vest be interrogated and that the patient stay for telemetry overnight in observation.  The patient's vest was interrogated by the [employer] representative and no events were noted however she has not been wearing the best more than 12 hours a day.  Given that her worst symptoms were when the vest was off she was admitted to obs for telemetry.     Elliot Gault, MD, 10/13/2016, 2:26 PM         Elliot Gault, MD  10/13/16 [phone]

## 2016-10-13 NOTE — First Provider Contact (Signed)
ED First Provider Contact Note    Initial provider evaluation performed by   ED First Provider Contact     Date/Time Event User Comments    10/13/16 [phone] ED First Provider Contact Tamala SerBASTIAN, [place] W Initial Face to Face Provider Contact        36 y/o female with hx of Vtach, currently with Lifevest, is referred to the ED by cardiologist for increased palpitations. Pt denies chest pain or syncope symptoms.      Vital signs reviewed.  BP 105/67 (BP Location: Right arm)   Pulse 98   Temp 36.4 C (97.5 F) (Temporal)    Resp 15   Ht 1.6 m (5\' 3" )   Wt 71.2 kg (157 lb)   SpO2 98%   BMI 27.81 kg/m2    Orders placed:  EKG and LABS     Patient requires further evaluation.     Tamala SerBradley W Jeffery Bachmeier, NP, 10/13/2016, 9:48 AM    Collaborating physician Carloyn MannerPeter Crane was immediately available     Tamala SerBastian, Laveah Gloster W, NP  10/13/16 [phone]

## 2016-10-13 NOTE — ED Notes (Addendum)
Assumed care of pt. Pt reports an acute episode of palpitations and chest discomfort ( midline between breasts) that began yesterday. Pt states these symptoms are intermittent. Pt also endorses DOE, nausea without emesis and a dry cough. Pt denies any HA, lightheadedness, near syncopal events or back pain at this time and will continue to be monitored on telemetry.

## 2016-10-13 NOTE — Progress Notes (Addendum)
10/13/16 1700   [facility] Patient Class Review   Patient Class Review Observation   Patient Class Effective 10/13/2016.    Utilization Management    Patient received OBS notification on 10/13/2016.     Notified patient of observation level of care. Patient stated understanding. Provided UM phone number for patient questions. Original notification and acknowledgement of receipt to medical record. Copies to patient. Will continue to monitor.     Cheralyn Oliver, RN     Pager: [phone]
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
## 2016-10-13 NOTE — ED Notes (Signed)
Plan of Care     Reviewed with pt and includes:   Continuous tele   VS per policy   Cardiac life vest intact   Cardiology to consult?

## 2016-10-13 NOTE — ED Notes (Addendum)
Plan of Care     Tele  Life vest in place   Replete magnesium  Cardiology to see in AM  NPO at midnight  Vitals q4  Supposed to have cardiac MRI tomorrow at [id number]

## 2016-10-13 NOTE — ED Notes (Signed)
Report Given To  Elnita Maxwellheryl, ED OBS RN      Descriptive Sentence / Reason for Admission   Patient hx of A-fib and Kirby FunkVtach presents to ED with c/o sudden onset chest pain and heart palpitations yesterday. Pt states that she felt fine this AM, but while doing laundry chest pain returned accompanied by SOB, chills, nausea, and diarrhea. Pt also endorses dry cough which started at the same time. Pt reports intermittent dizziness. Pt currently wearing a cardiac Life Vest      Active Issues / Relevant Events   CP  HA  Nausea  SOB  Dizziness  PMH - Afib/ wearing cardiac life vest      To Do List  -Monitor VS/Tele  -Pain mgmt  -EP interrogate life vest      Anticipatory Guidance / Discharge Planning  Admit: Palpitations

## 2016-10-14 ENCOUNTER — Observation Stay: Payer: Medicaid (Managed Care) | Admitting: Radiology

## 2016-10-14 ENCOUNTER — Encounter: Payer: Self-pay | Admitting: Internal Medicine

## 2016-10-14 ENCOUNTER — Telehealth: Payer: Self-pay | Admitting: Cardiovascular Disease

## 2016-10-14 ENCOUNTER — Ambulatory Visit: Payer: Medicaid (Managed Care) | Admitting: Radiology

## 2016-10-14 DIAGNOSIS — I480 Paroxysmal atrial fibrillation: Secondary | ICD-10-CM

## 2016-10-14 DIAGNOSIS — I499 Cardiac arrhythmia, unspecified: Secondary | ICD-10-CM

## 2016-10-14 LAB — CBC AND DIFFERENTIAL
Baso # K/uL: 0 10*3/uL (ref 0.0–0.1)
Basophil %: 0.4 %
Eos # K/uL: 0.4 10*3/uL (ref 0.0–0.4)
Eosinophil %: 4.3 %
Hematocrit: 39 % (ref 34–45)
Hemoglobin: 12.4 g/dL (ref 11.2–15.7)
IMM Granulocytes #: 0 10*3/uL (ref 0.0–0.1)
IMM Granulocytes: 0.2 %
Lymph # K/uL: 3.3 10*3/uL (ref 1.2–3.7)
Lymphocyte %: 39.8 %
MCH: 29 pg/cell (ref 26–32)
MCHC: 32 g/dL (ref 32–36)
MCV: 89 fL (ref 79–95)
Mono # K/uL: 0.5 10*3/uL (ref 0.2–0.9)
Monocyte %: 6.2 %
Neut # K/uL: 4 10*3/uL (ref 1.6–6.1)
Nucl RBC # K/uL: 0 10*3/uL (ref 0.0–0.0)
Nucl RBC %: 0 /100 WBC (ref 0.0–0.2)
Platelets: 330 10*3/uL (ref 160–370)
RBC: 4.3 MIL/uL (ref 3.9–5.2)
RDW: 14.4 % (ref 11.7–14.4)
Seg Neut %: 49.1 %
WBC: 8.2 10*3/uL (ref 4.0–10.0)

## 2016-10-14 LAB — PLASMA PROF 7 (ED ONLY)
Anion Gap,PL: 13 (ref 7–16)
CO2,Plasma: 24 mmol/L (ref 20–28)
Chloride,Plasma: 104 mmol/L (ref 96–108)
Creatinine: 0.69 mg/dL (ref 0.51–0.95)
GFR,Black: 129 *
GFR,Caucasian: 112 *
Glucose,Plasma: 100 mg/dL — ABNORMAL HIGH (ref 60–99)
Potassium,Plasma: 3.9 mmol/L (ref 3.4–4.7)
Sodium,Plasma: 141 mmol/L (ref 133–145)
UN,Plasma: 11 mg/dL (ref 6–20)

## 2016-10-14 MED ORDER — GADOBUTROL 1 MMOL/ML (GADAVIST) IV SOLN *WRAPPED*
0.2000 mL/kg | Freq: Once | INTRAVENOUS | Status: AC
Start: 2016-10-14 — End: 2016-10-14
  Administered 2016-10-14: 14.2 mL via INTRAVENOUS

## 2016-10-14 MED ORDER — LORAZEPAM 2 MG/ML IJ SOLN *I*
INTRAMUSCULAR | Status: AC
Start: 2016-10-14 — End: 2016-10-14
  Filled 2016-10-14: qty 1

## 2016-10-14 MED ORDER — LORAZEPAM 2 MG/ML IJ SOLN *I*
0.5000 mg | INTRAMUSCULAR | Status: AC | PRN
Start: 2016-10-14 — End: 2016-10-14
  Administered 2016-10-14: 0.5 mg via INTRAVENOUS

## 2016-10-14 MED ORDER — LORAZEPAM 2 MG/ML IJ SOLN *I*
1.0000 mg | Freq: Once | INTRAMUSCULAR | Status: DC
Start: 2016-10-14 — End: 2016-10-14

## 2016-10-14 NOTE — ED Notes (Signed)
ED CRN NOTE:    Pt in MRI at current time, appears anxious, requesting sedation or to have a companion in the scanner area with her during MRI, provider notified, sedation ordered obtained

## 2016-10-14 NOTE — ED Notes (Signed)
ED RN INTERN ATTESTATION       I Allie Dimmerracy L Elenora Hawbaker, RN (RN) reviewed the following charting information by the RN intern: Jim LikeMegan Eichas    Nursing Assessments  Medications  Plan of Care  Teaching   Notes    In the chart of Danielle Hall [age] y.o. female) and attest to the charting being accurate.
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
## 2016-10-14 NOTE — ED Notes (Signed)
Pt A&Ox4.  Assessment complete, medications administered, discussed plan of care.  Will continue to monitor.  Bed in lowest position, wheels locked, call light within reach.
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
## 2016-10-14 NOTE — Progress Notes (Signed)
Report Given    Diagnostic Imaging Nurse Handoff:    Study: Cardiac MRI  A&O: yes  Telemetry: yes  IV Access: 20 Ga  Hospital gown: yes  Continuous Infusions: no  Medication Patches/Insulin Pumps: no  Precautions: none  Transport: stretcher  Respiratory: room air  Sleep Apnea: no  Claustrophobic: no  Sedation Needed: no  Pain Concerns: denies  Able to Lay Flat: yes  Report Received From: Aundra MilletMegan RN

## 2016-10-14 NOTE — ED Notes (Signed)
Plan of Care     Tele  Cardiac MRI  Pain control  Comfort measures   Vitals q4h  Labs

## 2016-10-14 NOTE — Discharge Instructions (Signed)
You were evaluated in observation for palpitations. Your work up here was reassuring.    Follow up with your EP doctor at your scheduled appointment on 10/20/16.     Return to the ER for persistent palpitations, chest pain, shortness of breath, fever >101, any other concerns.    Continue your home medications as prescribed. Continue to wear life vest at all times.

## 2016-10-14 NOTE — Telephone Encounter (Signed)
Received notification that Ms. Musco's cardiac MRI was denied by her heath plan. I called for a peer-to-peer discussion. The study is now approved.    Authorization #: [id number]  For 45 days from 10/14/16     Thank you,  Gae Dry, MD 7:24 AM 10/14/2016  Fellow, Cardiovascular Disease    --------------------------------------------------------------------------------  [facility] has denied approval for your patient's exam.   GEORGEANNE FRANKLAND is scheduled for SCAN on DATE/TIME.   A peer to peer will be required to continue in pursuing this authorization. Clinical notes were faxed.     Reason for Denial: On 10/11/2016, [employer] on behalf of [place]   decided to deny this service because the service is not medically necessary.   You asked for [phone] - MRI of the heart without dye followed by an MRI with dye because you have ventricular tachycardia (a heart rhythm disorder).   To approve this service, the following criteria must be met: Your records must show you have results of a recent echocardiogram (ECHO) of your heart. An ECHO is a picture study that uses sound waves to produce pictures of your heart's actions.   These criteria are not met because you do not meet the criteria described below.   Your records must show you have results of a recent echocardiogram (ECHO) of your heart. An ECHO is a picture study that uses sound waves to produce pictures of your heart's actions.   (Reports were sent)     Peer to Peer Contact Info:     Insurance Phone#: [phone]     Case#: [id number]     Pt ID#: [id number]     Please reply to this message with outcome of the case.      Include Authorization #and Effective dates in response.     If not approved, please respond with next course of action for patient care.     Thank You,     Legrand Como

## 2016-10-18 LAB — EKG 12-LEAD
P: 52 deg
PR: 164 ms
QRS: 45 deg
QRSD: 84 ms
QT: 400 ms
QTc: 438 ms
Rate: 72 {beats}/min
Severity: NORMAL
T: 58 deg

## 2016-10-20 ENCOUNTER — Encounter: Payer: Self-pay | Admitting: Cardiology

## 2016-10-20 ENCOUNTER — Ambulatory Visit: Payer: Self-pay | Admitting: Cardiology

## 2016-10-20 ENCOUNTER — Observation Stay
Admission: AD | Admit: 2016-10-20 | Discharge: 2016-10-22 | Disposition: A | Discharge status: Home / Self Care | Payer: Medicaid (Managed Care) | Source: Other Acute Inpatient Hospital | Attending: Cardiology | Admitting: Cardiology

## 2016-10-20 ENCOUNTER — Other Ambulatory Visit: Payer: Self-pay | Admitting: Cardiology

## 2016-10-20 DIAGNOSIS — I480 Paroxysmal atrial fibrillation: Secondary | ICD-10-CM

## 2016-10-20 DIAGNOSIS — Z86718 Personal history of other venous thrombosis and embolism: Secondary | ICD-10-CM | POA: Insufficient documentation

## 2016-10-20 DIAGNOSIS — R55 Syncope and collapse: Secondary | ICD-10-CM

## 2016-10-20 DIAGNOSIS — I472 Ventricular tachycardia: Secondary | ICD-10-CM

## 2016-10-20 DIAGNOSIS — I471 Supraventricular tachycardia: Principal | ICD-10-CM | POA: Insufficient documentation

## 2016-10-20 HISTORY — DX: Depression, unspecified: F32.A

## 2016-10-20 LAB — CBC AND DIFFERENTIAL
Baso # K/uL: 0 10*3/uL (ref 0.0–0.1)
Basophil %: 0.6 %
Eos # K/uL: 0.1 10*3/uL (ref 0.0–0.4)
Eosinophil %: 1.4 %
Hematocrit: 39 % (ref 34–45)
Hemoglobin: 12.1 g/dL (ref 11.2–15.7)
IMM Granulocytes #: 0.1 10*3/uL (ref 0.0–0.1)
IMM Granulocytes: 1 %
Lymph # K/uL: 1.9 10*3/uL (ref 1.2–3.7)
Lymphocyte %: 27.7 %
MCH: 29 pg/cell (ref 26–32)
MCHC: 31 g/dL — ABNORMAL LOW (ref 32–36)
MCV: 94 fL (ref 79–95)
Mono # K/uL: 0.6 10*3/uL (ref 0.2–0.9)
Monocyte %: 8 %
Neut # K/uL: 4.3 10*3/uL (ref 1.6–6.1)
Nucl RBC # K/uL: 0 10*3/uL (ref 0.0–0.0)
Nucl RBC %: 0 /100 WBC (ref 0.0–0.2)
Platelets: 288 10*3/uL (ref 160–370)
RBC: 4.2 MIL/uL (ref 3.9–5.2)
RDW: 14.7 % — ABNORMAL HIGH (ref 11.7–14.4)
Seg Neut %: 61.3 %
WBC: 7 10*3/uL (ref 4.0–10.0)

## 2016-10-20 LAB — COMPREHENSIVE METABOLIC PANEL
ALT: 27 U/L (ref 0–35)
AST: 28 U/L (ref 0–35)
Albumin: 4.2 g/dL (ref 3.5–5.2)
Alk Phos: 60 U/L (ref 35–105)
Anion Gap: 15 (ref 7–16)
Bilirubin,Total: 0.4 mg/dL (ref 0.0–1.2)
CO2: 20 mmol/L (ref 20–28)
Calcium: 8.8 mg/dL (ref 8.8–10.2)
Chloride: 106 mmol/L (ref 96–108)
Creatinine: 0.7 mg/dL (ref 0.51–0.95)
GFR,Black: 128 *
GFR,Caucasian: 111 *
Glucose: 131 mg/dL — ABNORMAL HIGH (ref 60–99)
Lab: 11 mg/dL (ref 6–20)
Potassium: 3.4 mmol/L (ref 3.3–5.1)
Sodium: 141 mmol/L (ref 133–145)
Total Protein: 6.4 g/dL (ref 6.3–7.7)

## 2016-10-20 LAB — MAGNESIUM: Magnesium: 1.6 mEq/L (ref 1.3–2.1)

## 2016-10-20 MED ORDER — ENOXAPARIN SODIUM 40 MG/0.4ML IJ SOSY *I*
40.0000 mg | PREFILLED_SYRINGE | Freq: Once | INTRAMUSCULAR | Status: DC
Start: 2016-10-20 — End: 2016-10-20

## 2016-10-20 MED ORDER — LORAZEPAM 0.5 MG PO TABS *I*
0.2500 mg | ORAL_TABLET | Freq: Once | ORAL | Status: AC | PRN
Start: 2016-10-20 — End: 2016-10-20
  Administered 2016-10-20: 0.25 mg via ORAL
  Filled 2016-10-20: qty 1

## 2016-10-20 MED ORDER — DIPHENHYDRAMINE HCL 25 MG PO TABS *I*
25.0000 mg | ORAL_TABLET | Freq: Every evening | ORAL | Status: DC | PRN
Start: 2016-10-20 — End: 2016-10-23

## 2016-10-20 MED ORDER — SODIUM CHLORIDE 0.9 % INJ (FLUSH) WRAPPED *I*
10.0000 mL | Freq: Three times a day (TID) | Status: DC
Start: 2016-10-20 — End: 2016-10-23

## 2016-10-20 MED ORDER — ACETAMINOPHEN 325 MG PO TABS *I*
650.0000 mg | ORAL_TABLET | Freq: Four times a day (QID) | ORAL | Status: DC | PRN
Start: 2016-10-20 — End: 2016-10-21
  Administered 2016-10-20 – 2016-10-21 (×2): 650 mg via ORAL
  Filled 2016-10-20 (×2): qty 2
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
## 2016-10-20 NOTE — Progress Notes (Signed)
Report Given To     Kenyon AnaNathalie, RN      Descriptive Sentence / Reason for Admission     Desc. Sentence:  Brought from OSH for SVT. Arrived to OSH with HR in 200s, given IV adenosine with positive effect.    Hx: Afib, DVT, VT (wears lifevest)      Active Issues / Relevant Events     Active Issues:  Tele- NSR  Ind  A&Ox3        To Do List    To Do:  Monitor tele/VS q4h  NPO @ [facility] for EPS  HCP form?      Anticipatory Guidance / Discharge Planning    MM
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
## 2016-10-20 NOTE — Progress Notes (Signed)
Patient arrived on [address] at 1245 via stretcher.  Ambulated from stretcher to bed. Vital signs stable. A&Ox3. NSR on telemetry.   Pain currently 0/10.   Patient oriented to call bell, room, and unit.  Admission packet provided and explained, no questions or concerns at this time.  Belongings at bedside include: cell phone and clothes. Friend to bring her lifevest in to leave in room for future discharge.  Patient currently resting with call bell within reach.  Nursing will continue to monitor.    Verda CuminsALICIA KNAPP, RN
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
## 2016-10-21 ENCOUNTER — Encounter
Admission: AD | Disposition: A | Discharge status: Home / Self Care | Payer: Self-pay | Source: Other Acute Inpatient Hospital | Attending: Cardiology

## 2016-10-21 ENCOUNTER — Inpatient Hospital Stay: Payer: Medicaid (Managed Care) | Admitting: Anesthesiology

## 2016-10-21 DIAGNOSIS — I471 Supraventricular tachycardia: Secondary | ICD-10-CM

## 2016-10-21 HISTORY — PX: CARDIAC ELECTROPHYSIOLOGY PROCEDURE: SHX2680

## 2016-10-21 LAB — BASIC METABOLIC PANEL
Anion Gap: 11 (ref 7–16)
CO2: 23 mmol/L (ref 20–28)
Calcium: 8.4 mg/dL — ABNORMAL LOW (ref 8.8–10.2)
Chloride: 105 mmol/L (ref 96–108)
Creatinine: 0.66 mg/dL (ref 0.51–0.95)
GFR,Black: 131 *
GFR,Caucasian: 113 *
Glucose: 116 mg/dL — ABNORMAL HIGH (ref 60–99)
Lab: 14 mg/dL (ref 6–20)
Potassium: 3.8 mmol/L (ref 3.3–5.1)
Sodium: 139 mmol/L (ref 133–145)

## 2016-10-21 LAB — MAGNESIUM: Magnesium: 1.7 mEq/L (ref 1.3–2.1)

## 2016-10-21 SURGERY — ABLATION - SVT
Anesthesia: Monitor Anesthesia Care

## 2016-10-21 MED ORDER — ACETAMINOPHEN 500 MG PO TABS *I*
1000.0000 mg | ORAL_TABLET | Freq: Three times a day (TID) | ORAL | Status: DC
Start: 2016-10-21 — End: 2016-10-23

## 2016-10-21 MED ORDER — SODIUM CHLORIDE 0.9 % IV SOLN WRAPPED *I*
75.0000 mL/h | Status: DC
Start: 2016-10-21 — End: 2016-10-22

## 2016-10-21 MED ORDER — KETOROLAC TROMETHAMINE 30 MG/ML IJ SOLN *I*
30.0000 mg | Freq: Once | INTRAMUSCULAR | Status: AC
Start: 2016-10-21 — End: 2016-10-22
  Filled 2016-10-21: qty 1

## 2016-10-21 MED ORDER — MAGNESIUM-OXIDE 400 (241.3 MG) MG PO TABS *WRAPPED*
400.0000 mg | ORAL_TABLET | Freq: Every day | ORAL | Status: DC
Start: 2016-10-21 — End: 2016-10-23
  Filled 2016-10-21 (×2): qty 1

## 2016-10-21 MED ORDER — MIDAZOLAM HCL 1 MG/ML IJ SOLN *I* WRAPPED
INTRAMUSCULAR | Status: DC | PRN
Start: 2016-10-21 — End: 2016-10-21
  Administered 2016-10-21: 2 mg via INTRAVENOUS
  Administered 2016-10-21 (×2): 1 mg via INTRAVENOUS

## 2016-10-21 MED ORDER — PLASMA-LYTE IV SOLN *WRAPPED*
Status: DC | PRN
Start: 2016-10-21 — End: 2016-10-21

## 2016-10-21 MED ORDER — IBUPROFEN 400 MG PO TABS *I*
400.0000 mg | ORAL_TABLET | Freq: Once | ORAL | Status: DC
Start: 2016-10-21 — End: 2016-10-21
  Filled 2016-10-21: qty 1

## 2016-10-21 MED ORDER — LIDOCAINE HCL 1 % IJ SOLN *I*
INTRAMUSCULAR | Status: AC
Start: 2016-10-21 — End: 2016-10-21
  Filled 2016-10-21: qty 30

## 2016-10-21 MED ORDER — METOPROLOL TARTRATE 12.5 MG PO CAPS *I*
12.5000 mg | ORAL_CAPSULE | Freq: Two times a day (BID) | ORAL | Status: DC
Start: 2016-10-21 — End: 2016-10-23
  Administered 2016-10-21 – 2016-10-22 (×2): 12.5 mg via ORAL
  Filled 2016-10-21 (×4): qty 1

## 2016-10-21 MED ORDER — OXYCODONE HCL 5 MG/5ML PO SOLN *I*
5.0000 mg | Freq: Once | ORAL | Status: DC | PRN
Start: 2016-10-21 — End: 2016-10-21

## 2016-10-21 MED ORDER — HEPARIN SODIUM (PORCINE) 1000 UNIT/ML IJ SOLN *WRAPPED*
Status: AC
Start: 2016-10-21 — End: 2016-10-21
  Filled 2016-10-21: qty 10

## 2016-10-21 MED ORDER — PROPOFOL 10 MG/ML IV EMUL (INTERMITTENT DOSING) WRAPPED *I*
INTRAVENOUS | Status: DC | PRN
Start: 2016-10-21 — End: 2016-10-21
  Administered 2016-10-21 (×2): 50 mg via INTRAVENOUS
  Administered 2016-10-21: 30 mg via INTRAVENOUS
  Administered 2016-10-21: 50 mg via INTRAVENOUS

## 2016-10-21 MED ORDER — ISOPROTERENOL HCL 0.2 MG/ML IJ SOLN *I*
INTRAMUSCULAR | Status: AC
Start: 2016-10-21 — End: 2016-10-21
  Filled 2016-10-21: qty 1

## 2016-10-21 MED ORDER — OXYCODONE HCL 5 MG PO TABS *I*
5.0000 mg | ORAL_TABLET | Freq: Four times a day (QID) | ORAL | Status: DC | PRN
Start: 2016-10-21 — End: 2016-10-23
  Administered 2016-10-21: 5 mg via ORAL
  Filled 2016-10-21: qty 1

## 2016-10-21 MED ORDER — ISOPROTERENOL HCL 0.2 MG/ML IJ SOLN *I*
0.4000 mg | INTRAVENOUS | Status: DC | PRN
Start: 2016-10-21 — End: 2016-10-21
  Administered 2016-10-21 (×2): 2 ug/min via INTRAVENOUS
  Administered 2016-10-21: 4 ug/min via INTRAVENOUS

## 2016-10-21 MED ORDER — PROMETHAZINE HCL 25 MG/ML IJ SOLN *I*
6.2500 mg | Freq: Once | INTRAMUSCULAR | Status: AC | PRN
Start: 2016-10-21 — End: 2016-10-21

## 2016-10-21 MED ORDER — HALOPERIDOL LACTATE 5 MG/ML IJ SOLN *I*
0.5000 mg | Freq: Once | INTRAMUSCULAR | Status: AC | PRN
Start: 2016-10-21 — End: 2016-10-21

## 2016-10-21 MED ORDER — LORAZEPAM 0.5 MG PO TABS *I*
0.2500 mg | ORAL_TABLET | Freq: Once | ORAL | Status: AC
Start: 2016-10-21 — End: 2016-10-21
  Administered 2016-10-21: 0.25 mg via ORAL
  Filled 2016-10-21: qty 1

## 2016-10-21 MED ORDER — PROPOFOL INFUSION 10 MG/ML *I*
INTRAVENOUS | Status: DC | PRN
Start: 2016-10-21 — End: 2016-10-21
  Administered 2016-10-21: 30 ug/kg/min via INTRAVENOUS
  Administered 2016-10-21: 100 ug/kg/min via INTRAVENOUS
  Administered 2016-10-21: 50 ug/kg/min via INTRAVENOUS
  Administered 2016-10-21 (×3): 150 ug/kg/min via INTRAVENOUS
  Administered 2016-10-21: 100 ug/kg/min via INTRAVENOUS
  Administered 2016-10-21: 25 ug/kg/min via INTRAVENOUS

## 2016-10-21 MED ORDER — OXYCODONE HCL 5 MG/5ML PO SOLN *I*
10.0000 mg | Freq: Once | ORAL | Status: DC | PRN
Start: 2016-10-21 — End: 2016-10-21

## 2016-10-21 SURGICAL SUPPLY — 21 items
BLANKET FULL UNDERBDY 36 X 84 (Drape) ×2 IMPLANT
CABLE REPROCESS CB3412CT (Supply) ×2 IMPLANT
CABLE REPROCESS CR3425CTR (Supply) ×2 IMPLANT
CABLE REPROCESS CY1210CT (Supply) ×4 IMPLANT
CATH EZ STEER NAV DF 4MM (Cardiac Catheter (Interventional)) ×2 IMPLANT
CATH REPROCESS F5QF005RTR (Cardiac Catheter (Interventional)) ×2 IMPLANT
CATH REPROCESS FFQA005RTR (Cardiac Catheter (Interventional)) ×2 IMPLANT
CATH WEBSTER CS DF CURVE (Cardiac Catheter (Interventional)) ×2 IMPLANT
COVER ULTRASOUND PROBE TP 6 X 48IN (Supply) ×2 IMPLANT
DRAPE Z-GRAVITY DISP STER (Drape) ×2 IMPLANT
INTRO  7FR 11CM ENHANCED (Sheath) ×2 IMPLANT
INTRO 5F 11CM ENHANCED (Sheath) ×4 IMPLANT
INTRODUCER 8.5F SR0 (Sheath) ×1
PACK CUSTOM EP CATH (Pack) ×2
PACK CUSTOM EP CATHETER (Pack) ×1 IMPLANT
PAD ELECTRODE DEFIB POST LG (Supply) ×3 IMPLANT
PAD GROUNDING ELECTROSURGICAL 15FT DISP (Supply) ×6 IMPLANT
PATCH EP EXT REF CARTO 3 SYS (Supply) ×1 IMPLANT
PATCHE CARTO 3 EXT (Supply) ×1
SHEATH INTRO 8.5FR L63CM DIL 8.5FR SUP STIFF GWIRE 0.038IN 3MM FNGR STRAIGHTENABLE J TIP (Sheath) ×1 IMPLANT
SHIELD RADPAD INTERVENTIONAL (Supply) ×2 IMPLANT

## 2016-10-21 NOTE — Progress Notes (Signed)
Cardiology Progress Note    Date of Consult: 10/21/2016    Name: Danielle Hall Attending: Sherlyn LickMontgomery, Hall *   DOB: 06-29-1980 PCP: Danielle HoarWirth, Philipp C, MD   MRN: [id number] Primary Cardiologist: Danielle Maxcyonald Schwartz, MD   CSN: [id number] Admit Date:  10/20/2016     Subjective     I had the pleasure of seeing Danielle Hall in cardiology followup on 10/21/2016 s/p successful AVNRT ablation. She reports a mild headache and some midsternal chest discomfort described as feeling like a piece of food stuck in her chest s/p AVNRT ablation. Denies presyncope, dizziness, chest pain, palpitations, SOB, nausea.    Past Medical History:   Diagnosis Date    Atrial fibrillation     Depression     DVT (deep venous thrombosis) 2006    post knee surgery    Dysrhythmia     VT (ventricular tachycardia)      Past Surgical History:   Procedure Laterality Date    HERNIA REPAIR      KNEE SURGERY      TUBAL LIGATION         Medications     Current Facility-Administered Medications   Medication Dose Route Frequency    sodium chloride  10 mL Intravenous Q8H       Vitals and Physical Exam     [name]  height is 1.6 m (5\' 3" ) and weight is 73.9 kg (162 lb 14.7 oz). Her temporal temperature is 36 Hall (96.8 F). Her blood pressure is 100/49 (abnormal) and her pulse is 72. Her respiration is 18 and oxygen saturation is 100%.  Body mass index is 28.86 kg/(m^2).  I/O last 3 completed shifts:  09/05 0700 - 09/06 0659  In: 600 (8.1 mL/kg) [P.O.:600]  Out: - (0 mL/kg)   Net: 600  Weight: 73.9 kg   Objective:  General Appearance:  Comfortable, well-appearing and in no acute distress.    Vital signs: (most recent): Blood pressure 102/54, pulse 78, temperature 36 Hall (96.8 F), temperature source Temporal, resp. rate 18, height 1.6 m (5\' 3" ), weight 73.9 kg (162 lb 14.7 oz), SpO2 100 %.  No fever.  (BP 100/49.).    Output: Producing urine.    Lungs:  Normal respiratory rate and normal effort.  She is not in respiratory distress.  Breath sounds clear to  auscultation.  No wheezes, rales or rhonchi.    Heart: Normal rate.  Regular rhythm.  S1 normal and S2 normal.  No murmur, gallop or friction rub.   Chest: Symmetric chest wall expansion.   Extremities: There is no dependent edema.  (R venous femoral access site Hall/D/I, soft, mildly tender without hematoma. )  Neurological: Patient is alert and oriented to person, place and time.    Skin:  Warm and dry.  No rash.     Laboratory Data     Hematology:   Results in Past 730 Days  Result Component Current Result Previous Result   WBC 7.0 (10/20/2016) 8.2 (10/14/2016)   Hemoglobin 12.1 (10/20/2016) 12.4 (10/14/2016)   Hematocrit 39 (10/20/2016) 39 (10/14/2016)   Platelets 288 (10/20/2016) 330 (10/14/2016)     Chemistry:   Results in Past 730 Days  Result Component Current Result Previous Result   Sodium 139 (10/21/2016) 141 (10/20/2016)   Potassium 3.8 (10/21/2016) 3.4 (10/20/2016)   Creatinine 0.66 (10/21/2016) 0.70 (10/20/2016)   Glucose 116 (H) (10/21/2016) 131 (H) (10/20/2016)   Calcium 8.4 (L) (10/21/2016) 8.8 (10/20/2016)   Magnesium 1.7 (10/21/2016) 1.6 (10/20/2016)   AST  28 (10/20/2016) Not in Time Range   ALT 27 (10/20/2016) Not in Time Range   TSH 1.66 (10/13/2016) Not in Time Range     Coagulation Studies:   No results found for requested labs within last 730 days.     Cardiac:   No results found for requested labs within last 730 days.     Lipids:   No results found for requested labs within last 730 days.       Cardiac/Imaging Data & Risk Scores     Telemetry: NSR with episodic SVT mainly this AM up to 135bpm.          Echo Complete 09/17/2016    Narrative Normal LVEF without significant wall motion abnormalities. No significant   valvular abnormalities. No evidence of elevated pulmonary artery pressure,   but unable to rule out some right heart enlargement/dysfunction (see   text).                   Holter Monitor - 48 hour 09/10/2016    Narrative Holter Summary     Total Beats 222900 Recording Date 09/01/16   Length Recorded 48 hours 24 minutes  Analysis Date 09/10/16     Overall Rates    Maximum HR 146 bpm on 09/01/16 15:59   Mean HR 78 bpm   Minimum HR 56 bpm on 09/01/16 14:57     Ectopy     PVC Beats   PAC Beats    Count 666  Count 0   Percent 0.30 %  Percent 0.00 %   Max/Hr 86 on 09/01/16 15:0  Max/Hr 0 on 09/01/16 11:41     Pauses 0 Longest  on       Ventricular Arrhythmias Supraventricular Arrhythmias     VT   SVT 0   Longest 22 on 09/01/16 15:18  Longest  on     Max Rate 139 bpm on 09/01/16 15:18  Max Rate  on     Couplet 5  PAC Couplet    Triplet 0      R on T 0      Bigeminy 20      Trigeminy 21        Symptoms/Comments:   ----The rhythm during the 48 hour recording was Sinus Rhythm with changes   in P waves, suggestive of a shift in atrial mechansim. Sinus Arrhythmia   was present.      ---The rates during the recording averaged between 66 to 95 BPM. The   maximum average rate was 146 BPM. The minimum average rate was 56 BPM. The   average rate throughout the recording was 78 BPM. Please refer to hourly   counts and heart rate trend.    ----No Supraventricular Ectopy was present.     ----There were 666 predominantly uniform Ventricular Ectopic beats   observed with a rare second morphology and 1 beat of additional   morphologies. Five episodes of pairing were present.     ----One run of VT was noted lasting 22 beats in duration with a rate of   139 BPM.     ----Patient notes "headache over right eye" at 00:12-Friday which was   associated with rates in the 70's and 80's and 2 Ventricular Ectopic beats   noted at 00:10-Friday. Patient notes "felt heart beat few times, little   out of breath" at 00:32-Friday which was associated with rates in the 70's   and 1 run of Ventricular Trigeminy lasting 6 beats  in duration. Patient   notes "headache" at 5:17-Friday which was associated with rates in the   60's and 70's with rare isolated Ventricular Ectopic beats.     Holter Conclusion      The rhythm during the recording was Sinus Rhythm. No AV Block. No  pauses   over 2 seconds were noted. No Supraventricular Ectopy. No SVT. Rare   Ventricular Ectopy. One run of VT lasting 22 beats in duation. Symptomatic   events were associated with rates in the 60's to 80's with rare   Ventricular Ectopy.      Read By: Riorden/    For questions regarding this interpretation or for clinical consultation,  please call [phone].          To view the entire scanned Holter tracings in eRecord please click on the   blue hyperlink below under the Scans on Order section.          Cardiac MRI (10/14/16):  1. The left ventricle is normal in size and function. The LVEF is 58 %.    2. The right ventricle is normal in size and function. The RVEF is 51 %.    3. There is no abnormal delayed myocardial enhancement to suggest scar, inflammation, or infiltration.    4. Partially visualized 2.3 cm right lower pole renal lesion, likely a cyst. Ultrasound can be obtained to better evaluate this lesion.        Impression and Plan     Principal Problem:    SVT (supraventricular tachycardia)  Active Problems:    A-fib           This is an 36 y.o. female with history of syncope with prior NSVT (currently with LifeVest) who is transferred to [facility] for evaluation of palpitations which were noted to be SVT. Now s/p EP study with successful ablation of AVNRT.     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 RVEF 51%. There is no abnormal delayed myocardial enhancement to suggest scar, inflammation, or infiltration. Partially visualized 2.3 cm right lower pole renal lesion, likely a cyst. Ultrasound can be obtained to better evaluate this lesion.   - TTE (09/17/16): Normal LVEF. No WMA. No significant valvular abnormalities. No evidence of elevated pulmonary artery pressure, but unable to rule out some right heart enlargement/dysfunction.  - EP SVT & VT study today: inducible AVNRT and successful ablation. No inducible ventricular arrhythmias.  - Per EP, plan for loop recorder  implant tomorrow to further assess potential ventricular arrhythmias. Pt NPO after midnight in event that general anesthesia is used.   - Restart home med Lopressor 12.5mg  PO BID.  - Consider adenosine  IV push prn for sustained SVT.   - Post-procedure restrictions discussed.   - LifeVest to resume on discharge.  - Continuous telemetry.  - Daily BMP, Mg. K--3.8, Mg--1.7, Cr--0.66. CrCl: 136.6.  - pain management for chest discomfort post ablation: torodol  IV x 1; acetaminophen 1,000mg  PO q8hr. Oxycodone  q6hr prn x 2 doses for mod-severe pain.     Hx of atrial fibrillation  - No evidence of AFib on telemetry.  - Pt not anticoagulated b/Hall no recent episodes and only occurred in setting of pregnancy.    F/E/N:  - Oral  - Daily BMP/Mg  - Regular diet.     VTE Prophylaxis:   - Encourage ambulation.   - Continue to hold lovenox d/t EP study today and for loop recorder implant tomorrow.  Code Status: full code    Dispo: anticipated discharge tomorrow s/p loop recorder placement.     Iva Boop [place], [place]  Electronically signed on 10/21/2016 at 1:56 PM.

## 2016-10-21 NOTE — Plan of Care (Signed)
Alteration in comfort related to surgical procedure     Pain is relieved or minimized Completed or Resolved        Alteration in elimination     Adequate urinary output Completed or Resolved        Alteration in hydration/electrolytes     Patient's fluid/electrolyte balance optimized w/ minimal N/V Completed or Resolved        Alteration in level of consciousness     Patient oriented or at baseline Completed or Resolved        Alteration in mobility related to anesthesia or surgery     Returning motor/sensory function Completed or Resolved        Alteration in respiratory function     Sa02 > 92% or at pre-op level Completed or Resolved        Alteration of skin integrity related to surgical procedure     Maintain skin integrity with incision line approximated Completed or Resolved        Anxiety related surgical procedure/anesthesia and findings     Anxiety under control or perceived as acceptable to patient Completed or Resolved        Cardiac and Respiratory management     Evaluate cardiopulmonary function Completed or Resolved     Evaluate Cardiac Rhythm Completed or Resolved     No life threatening arrhythmia Completed or Resolved     Evaluate Risk factors for bleeding and implement measures to Completed or Resolved        Discharge Planning     Identify discharge needs Completed or Resolved        Hemodynamic Status     Patient has stable vital signs and fluid balance Completed or Resolved        Impaired cardiovascular/circulatory function     BP, Pulse within 20% of baseline and Cardiac rhythm stable Completed or Resolved        Impaired tissue perfusion     CMS and pulses WDL Completed or Resolved        Knowledge deficit related to pre or post-op regimens     Patient verbalizes understanding of PACU teaching Completed or Resolved        Mobility     Patient's functional status is maintained or improved Completed or Resolved        Pain/Comfort     Patient's pain or discomfort is  manageable Completed or Resolved        Safety     Patient will remain free of falls Completed or Resolved        Thermoregulation     Maintain body temperature of 36 C or above Completed or Resolved      Clemetine MarkerGabrielle Dash Cardarelli, RN

## 2016-10-21 NOTE — Progress Notes (Signed)
Descriptive Sentence/ Reason for admission (Big Picture)   Procedure: SVT ablation  Access: Right groin   Sedation Type and Amt: See anesthesia note  Baseline Neuro Status: A&Ox3, moving all extremities, and speech clear.    Active Issues/ Relevant Events (Last 24 hrs)   In Lab Complications?: None  Concerns:None  Hemostasis Type and Time: Dressing to R groin CDI  Post Procedure Neuro Status:  A&Ox3, moving all extremities, and speech clear.    Report given to [facility] RN    No concerns noted by receiving RN at time of handoff.      Ranee Gosselinachele Maclaine Ahola, RN  12:40 PM

## 2016-10-21 NOTE — Anesthesia Postprocedure Evaluation (Signed)
Anesthesia Post-Op Note    Patient: Danielle Hall, Danielle educationTerri Covino    Procedure(s) Performed:  Procedure Summary  Date:  10/21/2016 Anesthesia Start: 10/21/2016  9:06 AM Anesthesia Stop: 10/21/2016 12:37 PM Room / Location[facility]  [facility] EP LAB 2 [facility]   Procedure(s):  Ablation - SVT Diagnosis:  SVT Surgeon(s):  Zetta BillsHuang, David, MD  Vanessa KickHernandez, Natalia, MD Attending Anesthesiologist:  Izell CarolinaKHAN, Navayah Sok R, MD         Recovery Vitals  BP: 102/67 (10/21/2016  1:00 PM)  Heart Rate: 78 (10/21/2016  1:00 PM)  Heart Rate (via Pulse Ox): 79 (10/21/2016  1:00 PM)  Resp: 18 (10/21/2016  1:00 PM)  Temp: 36.4 C (97.5 F) (10/21/2016 12:45 PM)  SpO2: 100 % (10/21/2016  1:00 PM)   0-10 Scale: 0 (10/21/2016  1:00 PM)  Anesthesia type:  Monitor Anesthesia Care  Complications Noted During Procedure or in PACU:  None   Comment:    Patient Location:  PACU  Level of Consciousness:    Alert and awake  Patient Participation:     Able to participate  Temperature Status:    Normothermic  Oxygen Saturation:    Appropriate for condition  Cardiac Status:   Stable and appropriate for condition  Fluid Status:    Euvolemic  Airway Patency:     Yes  Pulmonary Status:    Stable  Pain Management:    Satisfactory to patient  Nausea and Vomiting:    Controlled    Post Op Assessment:    Tolerated procedure well   Attending Attestation:  All indicated post anesthesia care provided     -
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
## 2016-10-21 NOTE — Progress Notes (Signed)
10/21/16 1500   [facility] Patient Class Review   Patient Class Review Inpatient   Patient Class Effective 10/20/2016    Ilsa IhaSharon Irbin Fines RN  Utilization Management  Pager [phone]  Phone [phone]

## 2016-10-21 NOTE — Anesthesia Procedure Notes (Addendum)
---------------------------------------------------------------------------------------------------------------------------------------    AIRWAY   GENERAL INFORMATION AND STAFF    Patient location during procedure: Procedure Rm       Date of Procedure: 10/21/2016 9:32 AM  AIRWAY METHOD     Number of Attempts at Approach:  1    Number of Other Approaches Attempted:  0  FINAL AIRWAY DETAILS    Final Airway Type:  Nasal cannula    Head position required to avoid obstruction:  Neutral  ----------------------------------------------------------------------------------------------------------------------------------------

## 2016-10-21 NOTE — Progress Notes (Deleted)
Report Given To  Larence PenningYve, RN      Descriptive Sentence / Reason for Admission   Brought from OSH for SVT. Arrived to OSH with HR in 200s, given IV adenosine with positive effect.    Hx: Afib, DVT, VT (wears lifevest)      Active Issues / Relevant Events   Active Issues:  EF normal, 58%  Tele- NSR  Ind  A&Ox3  9/6 overnight: patient awoke feeling clammy and anxious, HR briefly elevated to 110, resolved after a few seconds, other VSS. 1 time dose of PO ativan 0.25 mg given for anxiety.  9/6: Successful ablation and EPS      To Do List    To Do:  Monitor tele/VS q4h  HCP form?  Bedrest until 1630      Anticipatory Guidance / Discharge Planning    MM

## 2016-10-21 NOTE — Plan of Care (Signed)
Cardiac and Respiratory management    • Evaluate cardiopulmonary function Maintaining    • Evaluate Cardiac Rhythm Maintaining    • No life threatening arrhythmia Maintaining    • Evaluate Risk factors for bleeding and implement measures to Maintaining        Discharge Planning    • Identify discharge needs Maintaining        Hemodynamic Status    • Patient has stable vital signs and fluid balance Maintaining        Mobility    • Patient's functional status is maintained or improved Maintaining        Pain/Comfort    • Patient's pain or discomfort is manageable Maintaining        Safety    • Patient will remain free of falls Maintaining

## 2016-10-21 NOTE — Progress Notes (Addendum)
Report Given To  Larence PenningYve, RN      Descriptive Sentence / Reason for Admission   Brought from OSH for SVT. Arrived to OSH with HR in 200s, given IV adenosine with positive effect.    Hx: Afib, DVT, VT (wears lifevest)      Active Issues / Relevant Events   Active Issues:  EF normal, 58%  Tele- NSR  Ind  A&Ox3  9/6 overnight: patient awoke feeling clammy and anxious, HR briefly elevated to 110, resolved after a few seconds, other VSS. 1 time dose of PO ativan 0.25 mg given for anxiety.  9/6: Successful ablation and EPS      To Do List    To Do:  Monitor tele/VS q4h  HCP form?  Last frequent VS @ 1530  Bedrest until 1630  EKG in am  Loop recorder implant tomorrow?      Anticipatory Guidance / Discharge Planning    MM

## 2016-10-21 NOTE — Progress Notes (Signed)
Verbal order received from Dr. Park BreedKahn, attending anesthesiologist, to advance to phase 2    Clemetine MarkerGabrielle Brennan Litzinger, RN

## 2016-10-21 NOTE — Progress Notes (Signed)
Pt arrived to CL .PACU area s/p SVT ablation via R femoral venous access. R femoral venous access with gauze & tegaderm, CD&I, no pain or hematoma noted. VSS, NSR on tele. Pt is A&Ox3, comprehensible speech, able to follow commands. Report received from [place] Chute[place], RN & Anesthesia.     Farrel DemarkKendra Avaneesh Pepitone, RN

## 2016-10-21 NOTE — Progress Notes (Signed)
CLINICAL CARDIAC ELECTROPHYSIOLOGY BRIEF POST-PROCEDURE NOTE    Patient underwent EP study with inducible AVNRT, and successful ablation, venous access via right femoral vein. Stimulation from the ventricle did not induce any ventricular arrhythmias.    POST-PROCEDURE PLAN:  - bed rest for 4 h  - resume metoprolol  - loop recorder implant tomorrow to further assess for potential ventricular arrhythmias  - patient should continue to wear LifeVest as outpatient

## 2016-10-21 NOTE — Progress Notes (Signed)
Pt arrived S/P SVT ablation. Pt denies paoin or SOB. NSR on monitor. VSS. Pt c/o full bladder. Unable to void in bedpan. Dr. Renaldo ReelHuang requested nursing straight cath patient. Pt st cathed per order for 900 cc light yellow urine. Pt tolerated well and states relief. Pt cleared for phase 2. Report called to 43600. Pt transported back to 43600.

## 2016-10-21 NOTE — Anesthesia Case Conclusion (Signed)
CASE CONCLUSION  Emergence  Criteria Used for Airway Removal:  Acceptable O2 saturation and adequate Tv & RR  Assessment:  Routine  Transport  Directly to: PACU  Airway:  Nasal cannula  Oxygen Delivery:  2 lpm  Position:  Recumbent  Patient Condition on Handoff  Level of Consciousness:  Alert/talking/calm  Patient Condition:  Stable  Handoff Report to:  RN

## 2016-10-21 NOTE — Progress Notes (Signed)
Patient arrived back to unit from cath lab at 1400. VSS; will continue to monitor VS q7030minx2, q1hx1 (see doc flowsheet).  Femoral site dressed with gauze and tegaderm.  Radial and pedal pulses +2.  Bed rest until 1630 reinforced, patient verbalizes understanding and is compliant. Patient denies any pain at this time.  Will continue to monitor.    Verda CuminsALICIA KNAPP, RN

## 2016-10-22 ENCOUNTER — Encounter
Admission: AD | Disposition: A | Discharge status: Home / Self Care | Payer: Self-pay | Source: Other Acute Inpatient Hospital | Attending: Cardiology

## 2016-10-22 ENCOUNTER — Other Ambulatory Visit: Payer: Self-pay | Admitting: Cardiology

## 2016-10-22 DIAGNOSIS — R55 Syncope and collapse: Secondary | ICD-10-CM

## 2016-10-22 DIAGNOSIS — I499 Cardiac arrhythmia, unspecified: Secondary | ICD-10-CM

## 2016-10-22 HISTORY — PX: PR IMPLANTATION PT-ACTIVATED CARDIAC EVENT RECORDER: 33282

## 2016-10-22 LAB — EKG 12-LEAD
P: 67 deg
PR: 172 ms
QRS: 44 deg
QRSD: 86 ms
QT: 404 ms
QTc: 443 ms
Rate: 72 {beats}/min
Severity: NORMAL
T: 59 deg

## 2016-10-22 LAB — BASIC METABOLIC PANEL
Anion Gap: 13 (ref 7–16)
CO2: 23 mmol/L (ref 20–28)
Calcium: 9.2 mg/dL (ref 8.8–10.2)
Chloride: 105 mmol/L (ref 96–108)
Creatinine: 0.77 mg/dL (ref 0.51–0.95)
GFR,Black: 114 *
GFR,Caucasian: 99 *
Glucose: 92 mg/dL (ref 60–99)
Lab: 11 mg/dL (ref 6–20)
Potassium: 4.1 mmol/L (ref 3.3–5.1)
Sodium: 141 mmol/L (ref 133–145)

## 2016-10-22 LAB — MAGNESIUM: Magnesium: 1.8 mEq/L (ref 1.3–2.1)

## 2016-10-22 SURGERY — LOOP RECORDER IMPLANT

## 2016-10-22 MED ORDER — ONDANSETRON HCL 2 MG/ML IV SOLN *I*
INTRAMUSCULAR | Status: DC | PRN
Start: 2016-10-22 — End: 2016-10-22
  Administered 2016-10-22: 2 mg via INTRAVENOUS

## 2016-10-22 MED ORDER — MIDAZOLAM HCL 5 MG/5ML IJ SOLN *I*
INTRAMUSCULAR | Status: DC | PRN
Start: 2016-10-22 — End: 2016-10-22
  Administered 2016-10-22 (×2): 1 mg

## 2016-10-22 MED ORDER — ONDANSETRON HCL 2 MG/ML IV SOLN *I*
INTRAMUSCULAR | Status: AC
Start: 2016-10-22 — End: 2016-10-22
  Filled 2016-10-22: qty 2

## 2016-10-22 MED ORDER — CEFAZOLIN 1000 MG IN STERILE WATER 10ML SYRINGE *I*
PREFILLED_SYRINGE | INTRAVENOUS | Status: AC
Start: 2016-10-22 — End: 2016-10-22
  Filled 2016-10-22: qty 20

## 2016-10-22 MED ORDER — MIDAZOLAM HCL 1 MG/ML IJ SOLN *I* WRAPPED
INTRAMUSCULAR | Status: AC
Start: 2016-10-22 — End: 2016-10-22
  Filled 2016-10-22: qty 2

## 2016-10-22 MED ORDER — LIDOCAINE-EPINEPHRINE 1 %-1:100000 IJ SOLN *I*
INTRAMUSCULAR | Status: AC
Start: 2016-10-22 — End: 2016-10-22
  Filled 2016-10-22: qty 50

## 2016-10-22 MED ORDER — CEFAZOLIN 1000 MG IN STERILE WATER 10ML SYRINGE *I*
PREFILLED_SYRINGE | INTRAVENOUS | Status: DC | PRN
Start: 2016-10-22 — End: 2016-10-22
  Administered 2016-10-22: 2000 mg via INTRAVENOUS

## 2016-10-22 SURGICAL SUPPLY — 2 items
PACK CUSTOM LOOP IMPLANT (Other) ×2 IMPLANT
SYSTEM REVEAL LINQ - BULK ×2 IMPLANT

## 2016-10-22 NOTE — Plan of Care (Signed)
Cardiac and Respiratory management     Evaluate cardiopulmonary function Maintaining     Evaluate Cardiac Rhythm Maintaining     No life threatening arrhythmia Maintaining        Discharge Planning     Identify discharge needs Maintaining     Beta-blocker prescribed at discharge Maintaining        Education/Support     Cardiac Risk factor modification Maintaining     Patient using effective coping mechanisms Maintaining        Fluid and Electrolyte Imbalance     Fluid and electrolyte balance are achieved/maintained Maintaining        Hemodynamic Status     Patient has stable vital signs and fluid balance Maintaining

## 2016-10-22 NOTE — Progress Notes (Signed)
On review of discharge instructions, pt relays that her LifeVest only has 1 bar of battery life remaining. Her family is coming from ~1 hr away and is unable to return home to bring a Consulting civil engineercharger and/or spare battery. LifeVest rep was consulted who believes that 1 bar could last 10445min-1hr. Soonest rep availability would be 2-3 hrs plus battery charging time. This was discussed with pt who was still insistent on leaving. Risks reviewed with pt in presence of family. Also reiterated instruction to refrain from driving until cleared by EP. She acknowledged understanding of the risks and still wished to return to home.     Plan discussed with attending physician who is agreeable.     Iva BoopLindsay Michelle Rogue RiverBering, [place] JerseyPA-C  6:10 PM

## 2016-10-22 NOTE — Progress Notes (Signed)
Descriptive Sentence/ Reason for admission (Big Picture)   Procedure: Loop Implant   Intervention/Results: Loop implanted successful.   Access: Midline chest  Sedation Type and Amt: 2mg  Versed  Baseline Neuro Status: A&Ox3, moving all extremities, and speech clear.    Active Issues/ Relevant Events (Last 24 hrs)   In Lab Complications?: None  Concerns:None  Hemostasis Type and Time:Surgical site dressed with derma bond and closed with suture. Good pleth, no hematoma.  VSS.   Post Procedure Neuro Status:  A&Ox3, moving all extremities, and speech clear.    Additional Meds in Procedure: Ancef 2g, Zofran 2mg   To Do List:   OOB: From stretcher to bed, with 2A tolerated well.     Anticipatory Guidance/DC Planning   Continue to monitor and tx per providers orders. Pt transported to inpatient unit 436 via writer, pt on defib monitor. Handoff report given to Nurse Charyl DancerAndrea C., RN.     No concerns noted by receiving RN at time of handoff.      Katrinka BlazingAndrea Berkley Wrightsman, RN  10:00 AM

## 2016-10-22 NOTE — Discharge Summary (Signed)
Name: Danielle Hall MRN: [id number] DOB: 05-Jan-1981     Admit Date: 10/20/2016   Date of Discharge: 10/22/2016    Patient was accepted for discharge to   Home or Self Care [1]           Discharge Attending Physician: Mannie StabileMONTGOMERY, CHRISTOPHER GEORGE      Hospitalization Summary    CONCISE NARRATIVE: This is an 36 y.o.femalewith a PMHx of syncope, prior NSVT (currently with LifeVest), and Hx of atrial fibrillation in setting of pregnancy (not on anti-coagulation with no recurrent episodes), who [facility] palpitations which were noted to be SVT, broken by adenosine. EP was consulted and pt underwent an EP SVT/VT study which resulted in successful AVNRT ablation. No inducible ventricular arrhythmias were found but a loop recorder was implanted given pt's Hx. Pt was discharged in stable condition with instructions to continue Lopressor as prescribed and to continue wearing the LifeVest as directed. F/U scheduled with Dr. Nedra HaiLee and Dr. Margo AyeHall.       CARDIAC TESTING:   EP study/ AVNRT (10/21/2016):   Successful ablation of typical AVNRT   Programmed ventricular stimulation did not induce any ventricular arrhythmias      OTHER PROCEDURES/FINDINGS: (10/22/2016): Successful implantation of a [employer] Linq implantable loop recorder  SIGNIFICANT MED CHANGES: None     CONSULTANT SERVICE   [place] Qu[place]HUANG, DAVID Cardiology                  Signed: Iva BoopLindsay Michelle [place] ParkBering, [place]  On: 10/22/2016  at: 6:16 PM
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
## 2016-10-22 NOTE — Discharge Instructions (Addendum)
[facility]  Patient Discharge Instructions    Attending Physician: Dr. Ericka PontiffMontgomery                                Admission Date:10/20/2016  Discharge Date: 10/22/2016    Brief Hospitalization Summary   [facility] an episode of chest palpitations and an abnormal heart rhythm  required the use of a medication called adenosine to break. While here, you underwent an electrophysiology (EP) study to look for abnormal heart rhythms. They were able to find and successfully ablate an abnormal rhythm originating in the top part of your heart. This is called atrioventricular nodal reentry tachycardia (AVNRT). They were not, however, able to find any abnormal rhythms coming from the bottom part of your heart in the ventricles. Because a prior Holter monitor study showed that you had been having abnormal ventricular rhythms that were not found during the EP study, a loop recorder was placed to monitor your heart rhythm at all times. The loop recorder will not shock you out of any abnormal heart rhythms, so it is still very important to be wearing your LifeVest as instructed.        Procedures Performed this Hospitalization  EP study/ AVNRT Ablation  Loop recorder implant     Recommended Diet  A healthy diet is important to help you stay well. Some health conditions require you to be on a special diet. For example, if you have heart failure you should monitor your fluid intake and limit the amount of sodium including table salt. This will help you avoid fluid retention which can cause shortness of breath or swelling of the feet and ankles. Reading food labels is helpful when you are on a special diet. Follow instructions from your doctor for any other special dietary requirement.    Recommended Activity  No lift/push/pull any object greater than 10 pounds for 7 days.  You may gradually return to full activity within one week.    If you have a job that does not require heavy lifting  or exertion, you may be able to return to work in 3 days.   If your job or activity requires physical exertion or heavy lifting, please wait one full week to return to it, unless specifically discussed with your doctor.   Use your common sense in returning to your normal activities.  No sexual activity for 4 days.    Please do not drive for your safety until your follow-up appointment with Dr. Margo AyeHall. Readdress the issue at this time. Tenderness in the groin area could slow your reflexes and cause an accident; you are at higher risk of bleeding from the site.    Wear your LifeVest at all times as instructed.     Gradually increase your activity level as you resume you regular daily activities.  It is normal to have some days where you feel more tired. If you are short of breath, you are going too fast and working too hard. If you feel tired during any activity, stop and rest.  Walk and climb stairs.     Wound Care:   There are no sutures to remove.    Keep the puncture site clean and dry for 24 hours.   You may shower the day after the procedure.  Clean the area daily with soap, allowing the water to run over the puncture site.   Gently  pat dry.  Cleanse area with clean/soapy water daily and pat dry.  Place clean band-aid over the site after washing until puncture site is healed.   It is not necessary, but may be more comfortable to keep a band-aid over the site for a few days, if desired. Although it is more comfortable for you to keep the area covered, it is okay to use a band-aid as long as the area is cleaned and the band-aid is changed daily until the site is healed.  Do not submerge your site in water for 7 days.    Questions or concerns:  It is normal to have:  1.  A lump at the puncture site. The lump can range in size of a pea to the size of a walnut. This lump will slowly go away over the next month  2.  Bruising around the site. The bruise may go though many color changes. It may take several weeks to  completely go away.  3.  Soreness, which will improve within a few days    What is not normal:  1.  Sudden bright red bleeding or new firmness at the puncture site. If this occurs, apply firm pressure to the site and call 911.      Pain Management  You may take acetaminophen (Tylenol) for pain as directed on bottle.    Medications  Please take your medications as outlined in the medication section of your discharge instructions. Please fill any new prescriptions right away.    Smoking  Smoking can increase your chances of developing chronic health problems or worsen conditions you already have. If you smoke you should quit. Smoking cessation information has been given to you for your review to help you quit.  Medications to help you quit are available. Ask your doctor if you would like to receive these medications.    What to do if my condition changes?  Within  24 hours of hospital discharge  If you experience chest pain, shortness of breath, signs of infection (redness, swelling, drainage, warmth to the touch over the puncture site, severe bruising in groin area, or fever > 101 degrees F), or have concerns specific to this hospitalization, call Dr. Ericka Pontiff at [phone].    24 hours after hospital discharge  If you experience chest pain, shortness of breath or other problems after 24 hours of hospital discharge call Monico Hoar, MD at [phone].    If you have a procedure-related (Electrophysiology study or ablation) or device-related (loop recorder) issue after 24 hrs of your procedure please contact the Electrophysiology Clinic at [phone] for Dr. Margo Aye.      Provider Signature: Rosemarie Ax, PA-C      Instructions reviewed with patient by: _____________________________________________________________             Signature/Title                                               Date

## 2016-10-22 NOTE — Progress Notes (Signed)
Report Given To  Jeanice LimHolly, RN      Descriptive Sentence / Reason for Admission   Brought from OSH for SVT. Arrived to OSH with HR in 200s, given IV adenosine with positive effect.    Hx: Afib, DVT, VT (wears lifevest and this is in closet in room)      Active Issues / Relevant Events   Active Issues:  EF normal, 58%  Tele- NSR  Ind  A&Ox3  9/6 overnight: patient awoke feeling clammy and anxious, HR briefly elevated to 110, resolved after a few seconds, other VSS. 1 time dose of PO ativan 0.25 mg given for anxiety.  9/6: Successful ablation and EPS  9/7 EKG complete      To Do List  Monitor tele/VS q4h  Monitor R femoral site  Loop recorder placement done 9/7, incision with dermabond and CDI      Anticipatory Guidance / Discharge Planning  Home later today?

## 2016-10-22 NOTE — Progress Notes (Signed)
Cardiology Progress Note    Date of Consult: 10/22/2016    Name: Danielle Hall Attending: Sherlyn Lick *   DOB: [birth year] PCP: Monico Hoar, MD   MRN: [id number] Primary Cardiologist: Joline Maxcy, MD   CSN: [id number] Admit Date:  10/20/2016     Subjective     I had the pleasure of seeing Danielle Hall in cardiology followup on 10/22/2016 s/p successful ILR implantation. 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 Today doing well. No chest discomfort. Does have some reported intermittent paresthesia at R venous femoral groin access site. Denies presyncope, dizziness, chest pain/pressure, palpitations, SOB, nausea.    Past Medical History:   Diagnosis Date    Atrial fibrillation     Depression     DVT (deep venous thrombosis) 2006    post knee surgery    Dysrhythmia     VT (ventricular tachycardia)      Past Surgical History:   Procedure Laterality Date    HERNIA REPAIR      KNEE SURGERY      TUBAL LIGATION         Medications     Current Facility-Administered Medications   Medication Dose Route Frequency    acetaminophen  1,000 mg Oral Q8H    metoprolol  12.5 mg Oral 2 times per day    magnesium oxide  400 mg Oral Daily    sodium chloride  10 mL Intravenous Q8H       Vitals and Physical Exam     Teniyah's  height is 1.6 m ( ) and weight is 73.9 kg (162 lb 14.7 oz). Her temporal temperature is 36.5 C (97.7 F). Her blood pressure is 100/50 and her pulse is 81. Her respiration is 16 and oxygen saturation is 96%.  Body mass index is 28.86 kg/(m^2).  I/O last 3 completed shifts:  09/06 0700 - 09/07 0659  In: 900 (12.2 mL/kg) [P.O.:400; I.V.:500 (0.3 mL/kg/hr)]  Out: 915 (12.4 mL/kg) [Urine:900 (0.5 mL/kg/hr); Blood:15]  Net: -15  Weight: 73.9 kg   Objective:  General Appearance:  Comfortable, well-appearing and in no acute distress.    Vital signs: (most recent): Blood pressure 100/50, pulse 81, temperature 36.5 C (97.7 F), temperature source  Temporal, resp. rate 16, height 1.6 m ( ), weight 73.9 kg (162 lb 14.7 oz), SpO2 96 %.  No fever.  (BP 100/50.  ).    Output: Producing urine.    Lungs:  Normal respiratory rate and normal effort.  She is not in respiratory distress.  Breath sounds clear to auscultation.  No wheezes, rales or rhonchi.    Heart: Normal rate.  Regular rhythm.  S1 normal and S2 normal.  No murmur, gallop or friction rub.   Chest: Symmetric chest wall expansion.   Extremities: There is no dependent edema.  (R venous femoral access site C/D/I, soft, nontender without hematoma. )  Neurological: Patient is alert and oriented to person, place and time.    Skin:  Warm and dry.  No rash. (Midsternal ILR incision C/D/I, mildly tender, soft, without ecchymosis or hematoma. Closed with DermaBond glue.)    Laboratory Data     Hematology:   Results in Past 730 Days  Result Component Current Result Previous Result   WBC 7.0 (10/20/2016) 8.2 (10/14/2016)   Hemoglobin 12.1 (10/20/2016) 12.4 (10/14/2016)   Hematocrit 39 (10/20/2016) 39 (10/14/2016)   Platelets 288 (10/20/2016) 330 (10/14/2016)     Chemistry:   Results in Past  730 Days  Result Component Current Result Previous Result   Sodium 141 (10/22/2016) 139 (10/21/2016)   Potassium 4.1 (10/22/2016) 3.8 (10/21/2016)   Creatinine 0.77 (10/22/2016) 0.66 (10/21/2016)   Glucose 92 (10/22/2016) 116 (H) (10/21/2016)   Calcium 9.2 (10/22/2016) 8.4 (L) (10/21/2016)   Magnesium 1.8 (10/22/2016) 1.7 (10/21/2016)   AST 28 (10/20/2016) Not in Time Range   ALT 27 (10/20/2016) Not in Time Range   TSH 1.66 (10/13/2016) Not in Time Range     Coagulation Studies:   No results found for requested labs within last 730 days.     Cardiac:   No results found for requested labs within last 730 days.     Lipids:   No results found for requested labs within last 730 days.       Cardiac/Imaging Data & Risk Scores     Telemetry: NSR.          Echo Complete 09/17/2016    Narrative Normal LVEF without significant wall motion abnormalities. No significant      valvular abnormalities. No evidence of elevated pulmonary artery pressure,   but unable to rule out some right heart enlargement/dysfunction (see   text).                Electrophysiology 10/22/2016    Narrative  Successful implantation of a Medtronic Linq implantable loop recorder         The patient was monitored continuously 1:1 throughout the entire procedure   by me while sedation was administered.              Holter Monitor - 48 hour 09/10/2016    Narrative Holter Summary     Total Beats 222900 Recording Date 09/01/16   Length Recorded 48 hours 24 minutes Analysis Date 09/10/16     Overall Rates    Maximum HR 146 bpm on 09/01/16 15:59   Mean HR 78 bpm   Minimum HR 56 bpm on 09/01/16 14:57     Ectopy     PVC Beats   PAC Beats    Count 666  Count 0   Percent 0.30 %  Percent 0.00 %   Max/Hr 86 on 09/01/16 15:0  Max/Hr 0 on 09/01/16 11:41     Pauses 0 Longest  on       Ventricular Arrhythmias Supraventricular Arrhythmias     VT   SVT 0   Longest 22 on 09/01/16 15:18  Longest  on     Max Rate 139 bpm on 09/01/16 15:18  Max Rate  on     Couplet 5  PAC Couplet    Triplet 0      R on T 0      Bigeminy 20      Trigeminy 21        Symptoms/Comments:   ----The rhythm during the 48 hour recording was Sinus Rhythm with changes   in P waves, suggestive of a shift in atrial mechansim. Sinus Arrhythmia   was present.      ---The rates during the recording averaged between 66 to 95 BPM. The   maximum average rate was 146 BPM. The minimum average rate was 56 BPM. The   average rate throughout the recording was 78 BPM. Please refer to hourly   counts and heart rate trend.    ----No Supraventricular Ectopy was present.     ----There were 666 predominantly uniform Ventricular Ectopic beats   observed with a rare second morphology and 1 beat of  additional   morphologies. Five episodes of pairing were present.     ----One run of VT was noted lasting 22 beats in duration with a rate of   139 BPM.     ----Patient notes "headache over  right eye" at 00:12-Friday which was   associated with rates in the 70's and 80's and 2 Ventricular Ectopic beats   noted at 00:10-Friday. Patient notes "felt heart beat few times, little   out of breath" at 00:32-Friday which was associated with rates in the 70's   and 1 run of Ventricular Trigeminy lasting 6 beats in duration. Patient   notes "headache" at 5:17-Friday which was associated with rates in the   60's and 70's with rare isolated Ventricular Ectopic beats.     Holter Conclusion      The rhythm during the recording was Sinus Rhythm. No AV Block. No pauses   over 2 seconds were noted. No Supraventricular Ectopy. No SVT. Rare   Ventricular Ectopy. One run of VT lasting 22 beats in duation. Symptomatic   events were associated with rates in the 60's to 80's with rare   Ventricular Ectopy.      Read By: Riorden/    For questions regarding this interpretation or for clinical consultation,  please call [phone].          To view the entire scanned Holter tracings in eRecord please click on the   blue hyperlink below under the Scans on Order section.          Cardiac MRI (10/14/16):  1. The left ventricle is normal in size and function. The LVEF is 58 %.    2. The right ventricle is normal in size and function. The RVEF is 51 %.    3. There is no abnormal delayed myocardial enhancement to suggest scar, inflammation, or infiltration.    4. Partially visualized 2.3 cm right lower pole renal lesion, likely a cyst. Ultrasound can be obtained to better evaluate this lesion.        Impression and Plan     Principal Problem:    SVT (supraventricular tachycardia)  Active Problems:    A-fib           This is an 36 y.o. female with history of syncope with prior NSVT (currently with LifeVest) who is transferred to [facility] for evaluation of palpitations which were noted to be SVT. Now s/p EP study with successful ablation of AVNRT and ILR placement.     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 RVEF 51%. There is no abnormal delayed myocardial enhancement to suggest scar, inflammation, or infiltration. Partially visualized 2.3 cm right lower pole renal lesion, likely a cyst. Ultrasound can be obtained to better evaluate this lesion.   - TTE (09/17/16): Normal LVEF. No WMA. No significant valvular abnormalities. No evidence of elevated pulmonary artery pressure, but unable to rule out some right heart enlargement/dysfunction.  - EP SVT & VT study (10/21/16), Dr. Renaldo Reel: inducible AVNRT and successful ablation. No inducible ventricular arrhythmias.  - Pt s/p ILR today to further assess potential ventricular arrhythmias.   - Continue home med Lopressor 12.5mg  PO BID.   - Post-ablation procedure restrictions readdressed.   - LifeVest to resume on discharge.  - Continuous telemetry.  - Daily BMP, Mg. K--4.1, Mg--1.8, Cr--0.77. CrCl: 117.06.  - pain management for chest discomfort post ablation: acetaminophen 1,000mg  PO q8hr. Oxycodone  q6hr prn x 2 doses for mod-severe pain.  Hx of atrial fibrillation  - No evidence of AFib on telemetry since admission.  - Pt not anticoagulated since event only occurred in setting of pregnancy and no documented recent episodes.    F/E/N:  - Oral  - Daily BMP/Mg  - Regular diet.     VTE Prophylaxis:   - Encourage ambulation. IPCs.  - Continue to hold lovenox d/t EP study yesterday and for loop recorder implant tomorrow.     Code Status: full code    Dispo: anticipated discharge today s/p loop recorder placement.     Iva Boop Simms, [place]  Electronically signed on 10/22/2016 at 10:46 AM.

## 2016-10-22 NOTE — Progress Notes (Signed)
Report Given To  Joyce Copaaylor Miller, RN      Descriptive Sentence / Reason for Admission   Brought from OSH for SVT. Arrived to OSH with HR in 200s, given IV adenosine with positive effect.    Hx: Afib, DVT, VT (wears lifevest)      Active Issues / Relevant Events   Active Issues:  EF normal, 58%  Tele- NSR  Ind  A&Ox3  9/6 overnight: patient awoke feeling clammy and anxious, HR briefly elevated to 110, resolved after a few seconds, other VSS. 1 time dose of PO ativan 0.25 mg given for anxiety.  9/6: Successful ablation and EPS  NPO for loop recorder implant 9/7  9/7 EKG complete      To Do List    To Do:  Monitor tele/VS q4h  Monitor R femoral site      Anticipatory Guidance / Discharge Planning    MM

## 2016-10-22 NOTE — H&P (Signed)
Pre-EP Procedure Note      Subjective:     Patient with a history of NSVT, SVT, AF s/p AVNRT RFA yesterday who presents for ILR today.    Past Medical History:   Diagnosis Date    Atrial fibrillation     Depression     DVT (deep venous thrombosis) 2006    post knee surgery    Dysrhythmia     VT (ventricular tachycardia)        History   Smoking Status    Never Smoker   Smokeless Tobacco    Never Used       Allergies:    Allergies   Allergen Reactions    Erythromycin Other (See Comments)     Unknown-childhood allergy per pt      Sulfa Antibiotics Other (See Comments)     Unknown childhood allergy       Prior to Admission Medications:  Prescriptions Prior to Admission   Medication Sig    metoprolol (LOPRESSOR) 25 MG tablet Take 0.5 tablets (12.5 mg total) by mouth 2 times daily       Active Hospital Medications:  Current Facility-Administered Medications   Medication Dose Route Frequency    sodium chloride 0.9 % IV  75 mL/hr Intravenous Continuous    acetaminophen (TYLENOL) tablet 1,000 mg  1,000 mg Oral Q8H    oxyCODONE (ROXICODONE) IR tablet 5 mg  5 mg Oral Q6H PRN    metoprolol (LOPRESSOR) capsule 12.5 mg  12.5 mg Oral 2 times per day    magnesium oxide (MAG-OX) tablet 400 mg  400 mg Oral Daily    sodium chloride 0.9 % flush 10 mL  10 mL Intravenous Q8H    diphenhydrAMINE (BENADRYL) tablet 25 mg  25 mg Oral QHS PRN       Objective:   Vitals:  Blood pressure 126/61, pulse 75, temperature 36.4 C (97.5 F), temperature source Temporal, resp. rate 16, height 1.6 m ( ), weight 73.9 kg (162 lb 14.7 oz), SpO2 99 %.    Vitals in last 24 hrs:  Patient Vitals for the past 24 hrs:   BP Temp Temp src Pulse Resp SpO2   10/22/16 0727 126/61 36.4 C (97.5 F) TEMPORAL 75 16 99 %   10/22/16 0544 (!) 94/46 36.3 C (97.3 F) TEMPORAL 71 16 97 %   10/21/16 2302 97/50 36.7 C (98.1 F) TEMPORAL 77 16 96 %   10/21/16 2020 (!) 105/49 36.4 C (97.5 F) TEMPORAL 82 16 97 %   10/21/16 1530 96/58 36.3 C (97.3 F)  TEMPORAL 82 18 99 %   10/21/16 1430 102/54 - - 78 18 100 %   10/21/16 1353 (!) 100/49 36 C (96.8 F) TEMPORAL 72 18 100 %   10/21/16 1330 100/64 - - 74 19 100 %   10/21/16 1315 103/69 - - 78 15 100 %   10/21/16 1300 102/67 - - 78 18 100 %   10/21/16 1245 106/76 36.4 C (97.5 F) TEMPORAL 92 18 99 %   10/21/16 1234 121/74 - - 92 11 100 %           Physical Exam  GEN - AO, NAD  AIRWAY VISIBILITY - soft palate, uvula and posterior pharynx  NECK - JVD not elevated  PULM - BS bil, no w/r/s/c/acc muscle use  C/V - RR, S1, S2, No m/r/g  ABD - NABS, soft, nt/nd  EXT - No LE edema  NM - Answering questions appropriately, sits up  independently    Lab Review  Lab Results   Component Value Date    NA 141 10/22/2016    K 4.1 10/22/2016    CL 105 10/22/2016    CO2 23 10/22/2016    UN 11 10/22/2016    CREAT 0.77 10/22/2016    CREAT 0.69 10/14/2016    CA 9.2 10/22/2016    GLU 92 10/22/2016    WBC 7.0 10/20/2016    HCT 39 10/20/2016    HGB 12.1 10/20/2016    MCV 94 10/20/2016    PLT 288 10/20/2016    GFRB 114 10/22/2016    GFRC 99 10/22/2016       Anesthesiologist's Physical Status Rating of the Patient:  Class II: Mild Systemic Disease    Plan for Sedation:  Moderate Conscious Sedation.  I am evaluating the patient immediately prior to admission of sedation medication.  The plan for sedation remains appropriate.    Assessment:   Danielle Hall is a 36 y.o. female with AF, NSVT, AVNRT RFA presents for ILR.    Plan:   Proceed with ILR.    Author:  Astrid DivineMEHMET Areonna Bran, MD  on 10/22/2016 at 8:51 AM

## 2016-10-23 ENCOUNTER — Encounter: Payer: Self-pay | Admitting: Cardiology

## 2016-10-25 ENCOUNTER — Encounter: Payer: Self-pay | Admitting: Cardiology

## 2016-10-26 ENCOUNTER — Encounter: Payer: Self-pay | Admitting: Cardiology

## 2016-10-27 ENCOUNTER — Encounter: Payer: Self-pay | Admitting: Cardiology

## 2016-11-01 LAB — EKG 12-LEAD
P: 52 deg
PR: 172 ms
QRS: 38 deg
QRSD: 80 ms
QT: 364 ms
QTc: 428 ms
Rate: 83 {beats}/min
Severity: NORMAL
T: 45 deg

## 2016-11-05 ENCOUNTER — Ambulatory Visit: Payer: Medicaid (Managed Care) | Attending: Internal Medicine | Admitting: Cardiology

## 2016-11-05 ENCOUNTER — Ambulatory Visit: Payer: Self-pay | Admitting: Cardiovascular Disease

## 2016-11-05 ENCOUNTER — Encounter: Payer: Self-pay | Admitting: Cardiology

## 2016-11-05 VITALS — BP 116/66 | HR 84 | Ht 63.0 in | Wt 167.0 lb

## 2016-11-05 DIAGNOSIS — I472 Ventricular tachycardia: Secondary | ICD-10-CM

## 2016-11-05 DIAGNOSIS — I4729 Other ventricular tachycardia: Secondary | ICD-10-CM

## 2016-11-05 DIAGNOSIS — I471 Supraventricular tachycardia: Secondary | ICD-10-CM
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
## 2016-11-11 ENCOUNTER — Other Ambulatory Visit: Payer: Self-pay | Admitting: Cardiology

## 2016-11-11 DIAGNOSIS — I472 Ventricular tachycardia, unspecified: Secondary | ICD-10-CM

## 2016-11-17 ENCOUNTER — Ambulatory Visit: Payer: Medicaid (Managed Care) | Attending: Internal Medicine | Admitting: Cardiology

## 2016-11-17 ENCOUNTER — Encounter: Payer: Self-pay | Admitting: Cardiology

## 2016-11-17 ENCOUNTER — Ambulatory Visit
Admission: RE | Admit: 2016-11-17 | Discharge: 2016-11-17 | Disposition: A | Discharge status: Home / Self Care | Payer: Medicaid (Managed Care) | Source: Ambulatory Visit | Attending: Internal Medicine | Admitting: Internal Medicine

## 2016-11-17 VITALS — BP 116/76 | HR 56 | Ht 63.0 in | Wt 165.0 lb

## 2016-11-17 DIAGNOSIS — I472 Ventricular tachycardia, unspecified: Secondary | ICD-10-CM

## 2016-11-17 DIAGNOSIS — R55 Syncope and collapse: Secondary | ICD-10-CM

## 2016-11-17 DIAGNOSIS — I4729 Other ventricular tachycardia: Secondary | ICD-10-CM

## 2016-11-17 DIAGNOSIS — I471 Supraventricular tachycardia: Secondary | ICD-10-CM

## 2016-11-17 MED ORDER — METOPROLOL TARTRATE 25 MG PO TABS *I*
25.0000 mg | ORAL_TABLET | Freq: Two times a day (BID) | ORAL | 3 refills | Status: DC
Start: 2016-11-17 — End: 2017-05-20

## 2016-11-17 NOTE — Patient Instructions (Signed)
You will increase your metoprolol to 50 mg twice daily. You will follow-up in clinic in 6 months

## 2016-11-18 LAB — IN PERSON LOOP DEVICE CHECK
Battery Date Time of Measurements: 20181003000000
Date Time Interrogation Session: 20181003160337
Implantable Pulse Generator Implant Date: 20180907000000
Lead Channel Setting Sensing Sensitivity: 0.035 mV
Recent AFIB Episodes: 0
Recent ATACH Episodes: 0
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
## 2016-11-21 ENCOUNTER — Ambulatory Visit
Admission: RE | Admit: 2016-11-21 | Discharge: 2016-11-21 | Disposition: A | Discharge status: Home / Self Care | Payer: Medicaid (Managed Care) | Source: Ambulatory Visit | Attending: Cardiology | Admitting: Cardiology

## 2016-11-21 DIAGNOSIS — R55 Syncope and collapse: Secondary | ICD-10-CM

## 2016-11-24 ENCOUNTER — Other Ambulatory Visit: Payer: Self-pay | Admitting: Cardiology

## 2016-11-24 DIAGNOSIS — R55 Syncope and collapse: Secondary | ICD-10-CM

## 2016-11-24 LAB — LOOP REMOTE CHARGEABLE DEVICE CHECK
Date Time Interrogation Session: 20181007153918
Implantable Pulse Generator Implant Date: 20180907000000

## 2016-12-17 ENCOUNTER — Ambulatory Visit: Payer: Medicaid (Managed Care) | Attending: Internal Medicine | Admitting: Cardiovascular Disease

## 2016-12-17 ENCOUNTER — Encounter: Payer: Self-pay | Admitting: Cardiovascular Disease

## 2016-12-17 VITALS — BP 104/61 | HR 60 | Temp 98.1°F | Resp 16 | Ht 63.0 in | Wt 168.0 lb

## 2016-12-17 DIAGNOSIS — Z713 Dietary counseling and surveillance: Secondary | ICD-10-CM

## 2016-12-17 DIAGNOSIS — I471 Supraventricular tachycardia: Secondary | ICD-10-CM | POA: Insufficient documentation

## 2016-12-17 NOTE — Progress Notes (Addendum)
Cardiology Office Revisit Note    Date of Visit: 12/17/2016 Patient: Danielle Hall   Patients PCP: Monico Hoar, MD Patient DOB: [birth year]     Subjective/Reason For Visit     I had the pleasure of seeing Danielle Hall in cardiology followup on 12/17/2016. She is a 36yo woman with a PMH of atypical AVNRT s/p ablation (10/21/16). She now has a loop recorder implanted which has not captured any subsequent arrhythmias.    At our visit today, Danielle Hall is feeling well. In fact, she reports feeling better than she has "in years." She has started a new exercise regimen of body weight exercise/ light weight training. No chest discomfort or shortness of breath with the exercise. In fact, she feels that her exercise capacity has improved compared to pre-ablation. She is not wearing the LifeVest anymore. No syncope or pre-syncope. No dizziness/ light-headedness. No palpitations.    Past Medical History:   Diagnosis Date    Atrial fibrillation     Depression     DVT (deep venous thrombosis) 2006    post knee surgery    Dysrhythmia     VT (ventricular tachycardia)      Past Surgical History:   Procedure Laterality Date    CARDIAC ELECTROPHYSIOLOGY PROCEDURE N/A 10/21/2016    Procedure: Ablation - SVT;  Surgeon: Zetta Bills, MD;  Location: [facility] EP LABS;  Service:     HERNIA REPAIR      KNEE SURGERY      PR IMPLANTATION PT-ACTIVATED CARDIAC EVENT RECORDER N/A 10/22/2016    Procedure: Loop Recorder Implant;  Surgeon: Astrid Divine, MD;  Location: [facility] EP LABS;  Service: Cardiovascular    TUBAL LIGATION         Review of Systems   Constitutional: Negative.    Eyes: Positive for pain (left eye stye).   Respiratory: Negative.  Negative for shortness of breath.    Cardiovascular: Negative.  Negative for chest pain, palpitations, claudication and leg swelling.   Musculoskeletal: Negative.    Neurological: Negative.  Negative for dizziness.   Psychiatric/Behavioral: Negative.  The patient is not nervous/anxious.           Medications     Current Outpatient Prescriptions   Medication Sig    clonazePAM (KLONOPIN) 0.5 MG tablet Take 0.5 mg by mouth every 4 hours as needed for Anxiety   Take 1/2 -1 tab every 4 hours as needed.    metoprolol (LOPRESSOR) 25 MG tablet Take 1 tablet (25 mg total) by mouth 2 times daily     Vitals and Physical Exam     Danielle Hall's  height is 1.6 m (5\' 3" ) and weight is 76.2 kg (168 lb). Her temperature is 36.7 C (98.1 F). Her blood pressure is 104/61 and her pulse is 60. Her respiration is 16 and oxygen saturation is 100%.  Body mass index is 29.76 kg/(m^2).    Physical Exam   Constitutional: She is oriented to person, place, and time. She appears well-developed and well-nourished.   HENT:   Head: Normocephalic and atraumatic.   Eyes: Conjunctivae are normal. Pupils are equal, round, and reactive to light.   Neck: No JVD present. Carotid bruit is not present.   Cardiovascular: Normal rate and regular rhythm.  Exam reveals no gallop and no friction rub.    No murmur heard.  Pulmonary/Chest: Effort normal and breath sounds normal.   Abdominal: Soft. Bowel sounds are normal.   Musculoskeletal: She exhibits no edema.   Neurological: She  is alert and oriented to person, place, and time.   Skin: Skin is warm and dry.     Laboratory Data     Hematology:   Results in Past 730 Days  Result Component Current Result Previous Result   WBC 7.0 (10/20/2016) 8.2 (10/14/2016)   Hemoglobin 12.1 (10/20/2016) 12.4 (10/14/2016)   Hematocrit 39 (10/20/2016) 39 (10/14/2016)   Platelets 288 (10/20/2016) 330 (10/14/2016)     Chemistry:   Results in Past 730 Days  Result Component Current Result Previous Result   Sodium 141 (10/22/2016) 139 (10/21/2016)   Potassium 4.1 (10/22/2016) 3.8 (10/21/2016)   Creatinine 0.77 (10/22/2016) 0.66 (10/21/2016)   Glucose 92 (10/22/2016) 116 (H) (10/21/2016)   Calcium 9.2 (10/22/2016) 8.4 (L) (10/21/2016)   Magnesium 1.8 (10/22/2016) 1.7 (10/21/2016)   AST 28 (10/20/2016) Not in Time Range   ALT 27 (10/20/2016) Not in Time Range   TSH  1.66 (10/13/2016) Not in Time Range     Coagulation Studies:   No results found for requested labs within last 730 days.     Cardiac:   No results found for requested labs within last 730 days.     Lipids:   No results found for requested labs within last 730 days.     Cardiac/Imaging Data & Risk Scores          Echo Complete 09/17/2016    Narrative Normal LVEF without significant wall motion abnormalities. No significant   valvular abnormalities. No evidence of elevated pulmonary artery pressure,   but unable to rule out some right heart enlargement/dysfunction (see   text).                Electrophysiology 10/22/2016    Narrative  Successful implantation of a [employer] Linq implantable loop recorder         The patient was monitored continuously 1:1 throughout the entire procedure   by me while sedation was administered.              Holter Monitor - 48 hour 09/10/2016    Narrative Holter Summary     Total Beats 222900 Recording Date 09/01/16   Length Recorded 48 hours 24 minutes Analysis Date 09/10/16     Overall Rates    Maximum HR 146 bpm on 09/01/16 15:59   Mean HR 78 bpm   Minimum HR 56 bpm on 09/01/16 14:57     Ectopy     PVC Beats   PAC Beats    Count 666  Count 0   Percent 0.30 %  Percent 0.00 %   Max/Hr 86 on 09/01/16 15:0  Max/Hr 0 on 09/01/16 11:41     Pauses 0 Longest  on       Ventricular Arrhythmias Supraventricular Arrhythmias     VT   SVT 0   Longest 22 on 09/01/16 15:18  Longest  on     Max Rate 139 bpm on 09/01/16 15:18  Max Rate  on     Couplet 5  PAC Couplet    Triplet 0      R on T 0      Bigeminy 20      Trigeminy 21        Symptoms/Comments:   ----The rhythm during the 48 hour recording was Sinus Rhythm with changes   in P waves, suggestive of a shift in atrial mechansim. Sinus Arrhythmia   was present.      ---The rates during the recording averaged between 66 to 95 BPM.  The   maximum average rate was 146 BPM. The minimum average rate was 56 BPM. The   average rate throughout the recording was 78  BPM. Please refer to hourly   counts and heart rate trend.    ----No Supraventricular Ectopy was present.     ----There were 666 predominantly uniform Ventricular Ectopic beats   observed with a rare second morphology and 1 beat of additional   morphologies. Five episodes of pairing were present.     ----One run of VT was noted lasting 22 beats in duration with a rate of   139 BPM.     ----Patient notes "headache over right eye" at 00:12-Friday which was   associated with rates in the 70's and 80's and 2 Ventricular Ectopic beats   noted at 00:10-Friday. Patient notes "felt heart beat few times, little   out of breath" at 00:32-Friday which was associated with rates in the 70's   and 1 run of Ventricular Trigeminy lasting 6 beats in duration. Patient   notes "headache" at 5:17-Friday which was associated with rates in the   60's and 70's with rare isolated Ventricular Ectopic beats.     Holter Conclusion      The rhythm during the recording was Sinus Rhythm. No AV Block. No pauses   over 2 seconds were noted. No Supraventricular Ectopy. No SVT. Rare   Ventricular Ectopy. One run of VT lasting 22 beats in duation. Symptomatic   events were associated with rates in the 60's to 80's with rare   Ventricular Ectopy.      Read By: Riorden/    For questions regarding this interpretation or for clinical consultation,  please call [phone].          To view the entire scanned Holter tracings in eRecord please click on the   blue hyperlink below under the Scans on Order section.             Impression and Plan     Patient Active Problem List   Diagnosis Code    A-fib I48.91    DVT (deep venous thrombosis) I82.409    SVT (supraventricular tachycardia) I47.1       This is an 36 y.o. female with a PMH of atypical AVNRT s/p ablation (10/21/16). She is doing well and does not have any cardiac complaints at this time. Today we discussed prevention of future cardiac disease including diet and exercise recommendations.    1.  AVNRT (AV nodal re-entry tachycardia)  S/p ablation. She is doing well with good exercise tolerance, no tachycardia, no palpitations, and no pre-syncope/syncope. She has a loop recorder implanted and has home monitoring capabilities. Will continue this to assess for future arrhythmia. She will continue metoprolol as it is not causing any side effects and may be helpful in arrhythmia prevention. She will call back with any issues.    2. Dietary counseling  Discussed prevention of CAD including diet and exercise. I recommended a Mediterranean/ plant-based diet.      Return to clinic as needed for palpitations or pre-syncope or should any new symptoms arise.  Discussed with Dr. Robby SermonKarl Laporche Martelle.     Danielle JarredElizabeth Carol Lee, MD  Electronically signed on 12/17/2016 at 8:18 AM.    I personally saw and evaluated the patient. I agree with the findings and care plan as documented above.     Stefan ChurchKarl Q. Henna Derderian, MD  Professor of Medicine  Director, Echocardiography Lab

## 2016-12-17 NOTE — Patient Instructions (Signed)
Ms. Corporate treasurerCorsner--    HERE IS WHAT WE DISCUSSED TODAY:     For diet: I recommend a "Mediterranean" or "Plant-Based" diet   Get most of your protein from plant sources: beans, tofu, seitan, tempeh   Use olive oil   Avoid meat   Avoid carbs/ sugar   Congratulations on starting your exercise program! Keep up the good work   Call if you are dizzy/ light-headed   Call if you feel like you're going to faint. Call if you do faint   Call if you have any chest pain      Come back if you need us.    Please call the clinic if you have any questions.    Take care,  Gillian ShieldsElizabeth C. Jessiah Wojnar, MD  Cardiology Fellow

## 2016-12-21 ENCOUNTER — Ambulatory Visit
Admission: RE | Admit: 2016-12-21 | Discharge: 2016-12-21 | Disposition: A | Discharge status: Home / Self Care | Payer: Medicaid (Managed Care) | Source: Ambulatory Visit | Attending: Cardiology | Admitting: Cardiology

## 2016-12-21 DIAGNOSIS — R55 Syncope and collapse: Secondary | ICD-10-CM

## 2016-12-23 ENCOUNTER — Other Ambulatory Visit: Payer: Self-pay | Admitting: Cardiology

## 2016-12-23 DIAGNOSIS — R55 Syncope and collapse: Secondary | ICD-10-CM

## 2016-12-31 LAB — LOOP REMOTE CHARGEABLE DEVICE CHECK
Date Time Interrogation Session: 20181106160731
Implantable Pulse Generator Implant Date: 20180907000000

## 2017-01-20 ENCOUNTER — Ambulatory Visit
Admission: RE | Admit: 2017-01-20 | Discharge: 2017-01-20 | Disposition: A | Discharge status: Home / Self Care | Payer: Medicaid (Managed Care) | Source: Ambulatory Visit | Attending: Cardiology | Admitting: Cardiology

## 2017-01-20 DIAGNOSIS — R55 Syncope and collapse: Secondary | ICD-10-CM

## 2017-01-28 ENCOUNTER — Other Ambulatory Visit: Payer: Self-pay | Admitting: Cardiology

## 2017-01-28 DIAGNOSIS — R55 Syncope and collapse: Secondary | ICD-10-CM

## 2017-01-31 LAB — LOOP REMOTE CHARGEABLE DEVICE CHECK
Date Time Interrogation Session: 20181206163747
Implantable Pulse Generator Implant Date: 20180907000000

## 2017-02-19 ENCOUNTER — Ambulatory Visit
Admission: RE | Admit: 2017-02-19 | Discharge: 2017-02-19 | Disposition: A | Discharge status: Home / Self Care | Payer: Medicaid (Managed Care) | Source: Ambulatory Visit | Attending: Cardiology | Admitting: Cardiology

## 2017-02-19 DIAGNOSIS — R55 Syncope and collapse: Secondary | ICD-10-CM

## 2017-02-23 LAB — LOOP REMOTE CHARGEABLE DEVICE CHECK
Date Time Interrogation Session: 20190105164044
Implantable Pulse Generator Implant Date: 20180907000000

## 2017-03-04 ENCOUNTER — Other Ambulatory Visit: Payer: Self-pay | Admitting: Cardiology

## 2017-03-04 DIAGNOSIS — R55 Syncope and collapse: Secondary | ICD-10-CM

## 2017-03-21 ENCOUNTER — Ambulatory Visit
Admission: RE | Admit: 2017-03-21 | Discharge: 2017-03-21 | Disposition: A | Discharge status: Home / Self Care | Payer: Medicaid (Managed Care) | Source: Ambulatory Visit | Attending: Cardiology | Admitting: Cardiology

## 2017-03-21 DIAGNOSIS — R55 Syncope and collapse: Secondary | ICD-10-CM

## 2017-03-24 ENCOUNTER — Other Ambulatory Visit: Payer: Self-pay | Admitting: Cardiology

## 2017-03-24 DIAGNOSIS — R55 Syncope and collapse: Secondary | ICD-10-CM

## 2017-03-25 LAB — LOOP REMOTE CHARGEABLE DEVICE CHECK
Date Time Interrogation Session: 20190204194336
Implantable Pulse Generator Implant Date: 20180907000000

## 2017-04-20 ENCOUNTER — Ambulatory Visit
Admission: RE | Admit: 2017-04-20 | Discharge: 2017-04-20 | Disposition: A | Discharge status: Home / Self Care | Payer: Medicaid (Managed Care) | Source: Ambulatory Visit | Attending: Cardiology | Admitting: Cardiology

## 2017-04-20 ENCOUNTER — Encounter: Payer: Self-pay | Admitting: Ophthalmology

## 2017-04-20 ENCOUNTER — Ambulatory Visit: Payer: Medicaid (Managed Care) | Admitting: Ophthalmology

## 2017-04-20 VITALS — BP 102/56 | HR 65 | Ht 63.0 in | Wt 156.0 lb

## 2017-04-20 DIAGNOSIS — R55 Syncope and collapse: Secondary | ICD-10-CM

## 2017-04-20 DIAGNOSIS — H0016 Chalazion left eye, unspecified eyelid: Secondary | ICD-10-CM

## 2017-04-20 DIAGNOSIS — H0013 Chalazion right eye, unspecified eyelid: Secondary | ICD-10-CM | POA: Insufficient documentation

## 2017-04-20 MED ORDER — TOBRAMYCIN-DEXAMETHASONE 0.3-0.1 % OP OINT *I*
TOPICAL_OINTMENT | Freq: Three times a day (TID) | OPHTHALMIC | 3 refills | Status: DC
Start: 2017-04-20 — End: 2018-03-27

## 2017-04-20 MED ORDER — PROPARACAINE HCL 0.5 % OP SOLN (MULTIPLE PATIENTS) *I*
1.0000 [drp] | Freq: Once | OPHTHALMIC | Status: AC
Start: 2017-04-20 — End: 2017-04-20
  Administered 2017-04-20: 1 [drp] via OPHTHALMIC

## 2017-04-20 NOTE — Progress Notes (Signed)
Outpatient Visit      Patient name: Danielle Hall  DOB: [birth year]       Age: 37 y.o.  MR#: [id number]    Encounter Date: 04/20/2017    Subjective:     Chief Complaint:   Chief Complaint   Patient presents with    New Patient Visit     Possible Chalazion left eye    Other     discharge left eye    Other     bump both eyes    Foreign Body Sensation In Eye     grainy feeling both eyes    Eye Itching    Other     lashes falling out     HPI     New Patient Visit    Additional comments: Possible Chalazion left eye           Other    Additional comments: discharge left eye           Other    Additional comments: bump both eyes           Foreign Body Sensation In Eye    Additional comments: grainy feeling both eyes           Other    Additional comments: lashes falling out           Comments   Danielle Matteserri Switzer is a 37 year old woman here for new patient exam with   complaints of a possible chalazion left eye    Patient notes since fall she thought she had a stye in "right" eye that   took 6 - 8 weeks to go away that left a pink spot for weeks.  She then got   a stye left upper lid, was given an oral antibiotic that helped within 2   days.  By the time she finished it, it began to return.  Now red large   bump that finally went away. Weeks later the bump popped but lasted weeks.    Has a lot of flaking across eye lids, eyes feel grainy/ Continues to get   little white pimples both upper lids, left more than right and her   eyelashes are falling off.  Both eyes itch        Last edited by Lavenia Atlashen, Dushawn Pusey, MD on 04/20/2017 10:49 AM. (History)          Current Outpatient Prescriptions:     fluconazole (DIFLUCAN) 150 MG tablet, , Disp: , Rfl:     mupirocin (BACTROBAN) 2 % ointment, , Disp: , Rfl:     sulfamethoxazole-trimethoprim (BACTRIM DS,SEPTRA DS) 800-160 MG per tablet, , Disp: , Rfl:     triamcinolone (KENALOG) 0.5 % cream, , Disp: , Rfl:     clonazePAM (KLONOPIN) 0.5 MG tablet, Take 0.5 mg by mouth every 4 hours  as needed for Anxiety   Take 1/2 -1 tab every 4 hours as needed., Disp: , Rfl:     metoprolol (LOPRESSOR) 25 MG tablet, Take 1 tablet (25 mg total) by mouth 2 times daily, Disp: 180 tablet, Rfl: 3    tobramycin-dexamethasone (TOBRADEX) ophthalmic ointment, Place into both eyes 3 times daily, Disp: 3.5 g, Rfl: 3  Allergies: Erythromycin and Sulfa antibiotics     Medical History:   Past Medical History:   Diagnosis Date    Atrial fibrillation     Chalazion of both eyes     Depression     DVT (deep venous thrombosis) 2006  post knee surgery    Dysrhythmia     VT (ventricular tachycardia)         Surgical History:   Past Surgical History:   Procedure Laterality Date    CARDIAC ELECTROPHYSIOLOGY PROCEDURE N/A 10/21/2016    Procedure: Ablation - SVT;  Surgeon: Zetta Bills, MD;  Location: [facility] EP LABS;  Service:     HERNIA REPAIR      KNEE SURGERY      PR IMPLANTATION PT-ACTIVATED CARDIAC EVENT RECORDER N/A 10/22/2016    Procedure: Loop Recorder Implant;  Surgeon: Astrid Divine, MD;  Location: [facility] EP LABS;  Service: Cardiovascular    TUBAL LIGATION          ROS     Positive for: Eyes ( possible chalazion bump itching foreign body   sensation discharge )    Negative for: Constitutional, Gastrointestinal, Neurological, Skin,   Genitourinary, Musculoskeletal, HENT, Endocrine, Cardiovascular,   Respiratory, Psychiatric, Allergic/Imm, Heme/Lymph    Last edited by Lynnda Child on 04/20/2017 10:35 AM. (History)      Objective:     Base Eye Exam     Visual Acuity (Snellen - Linear)       Right Left    Dist sc 20/20 -2 20/20 -1          Tonometry (Tonopen, 11:09 AM)       Right Left    Pressure 08 10          Pupils       Dark Light Shape React APD    Right 5 3 Round Brisk None    Left 5 3 Round Brisk None          Neuro/Psych     Oriented x3:  Yes    Mood/Affect:  Normal            Slit Lamp and Fundus Exam     External Exam       Right Left    External Normal ocular adnexae, lacrimal gland & drainage, orbits Normal  ocular adnexae, lacrimal gland & drainage, orbits          Slit Lamp Exam       Right Left    Lids/Lashes blepharitis blepharitis, LLL chronic chalazion    Conjunctiva/Sclera Normal bulbar/palpebral, conjunctiva, sclera Normal bulbar/palpebral, conjunctiva, sclera    Cornea Normal epithelium, stroma, endothelium, tear film Normal epithelium, stroma, endothelium, tear film    Anterior Chamber Clear & deep Clear & deep    Iris Normal shape, size, morphology Normal shape, size, morphology    Lens Normal cortex, nucleus, anterior/posterior capsule, clarity Normal cortex, nucleus, anterior/posterior capsule, clarity    Vitreous Clear Clear          Fundus Exam       Right Left    Disc Normal size, appearance, nerve fiber layer Normal size, appearance, nerve fiber layer    C/D Ratio 0.1 0.1            Refraction     Wearing Rx       Sphere    Right none    Left none                      No annotated images are attached to the encounter.      Assessment/Plan:     1. Chalazion of both eyes   proparacaine (ALCAINE) 0.5% ophthalmic solution       Assessment      Chalazion  -  since 11/2017 with recurrent chalazia.  - LLL with chalazion, but will hold on surgery  - continue Ocusoft wipe  - continue warm compresses BID (counseled on 5 min each time)   - Tobradex ung BID trial for 2 wks  - hold on oral, but consider if still with issue on follow-up    Follow-up  2-3 months surface check, plan for [facility]       Patient seen and discussed with Dr. Vicenta Aly, MD  PGY4, Ophthalmology  04/20/2017  11:17 AM

## 2017-04-25 ENCOUNTER — Other Ambulatory Visit: Payer: Self-pay | Admitting: Cardiology

## 2017-04-25 DIAGNOSIS — R55 Syncope and collapse: Secondary | ICD-10-CM

## 2017-05-06 ENCOUNTER — Other Ambulatory Visit: Payer: Self-pay | Admitting: Transplant

## 2017-05-06 DIAGNOSIS — Z4509 Encounter for adjustment and management of other cardiac device: Secondary | ICD-10-CM

## 2017-05-06 DIAGNOSIS — R55 Syncope and collapse: Secondary | ICD-10-CM

## 2017-05-09 LAB — LOOP REMOTE CHARGEABLE DEVICE CHECK
Date Time Interrogation Session: 20190306201127
Implantable Pulse Generator Implant Date: 20180907000000

## 2017-05-18 ENCOUNTER — Ambulatory Visit: Payer: Self-pay

## 2017-05-20 ENCOUNTER — Ambulatory Visit: Payer: Medicaid (Managed Care) | Admitting: Cardiovascular Disease

## 2017-05-20 ENCOUNTER — Encounter: Payer: Self-pay | Admitting: Cardiovascular Disease

## 2017-05-20 ENCOUNTER — Ambulatory Visit
Admission: RE | Admit: 2017-05-20 | Discharge: 2017-05-20 | Disposition: A | Discharge status: Home / Self Care | Payer: Medicaid (Managed Care) | Source: Ambulatory Visit | Attending: Cardiology | Admitting: Cardiology

## 2017-05-20 VITALS — BP 121/72 | HR 69 | Resp 16 | Ht 63.0 in | Wt 147.6 lb

## 2017-05-20 DIAGNOSIS — I471 Supraventricular tachycardia: Secondary | ICD-10-CM

## 2017-05-20 DIAGNOSIS — I480 Paroxysmal atrial fibrillation: Secondary | ICD-10-CM

## 2017-05-20 DIAGNOSIS — R55 Syncope and collapse: Secondary | ICD-10-CM

## 2017-05-20 DIAGNOSIS — F419 Anxiety disorder, unspecified: Secondary | ICD-10-CM

## 2017-05-20 MED ORDER — METOPROLOL TARTRATE 25 MG PO TABS *I*
25.0000 mg | ORAL_TABLET | Freq: Two times a day (BID) | ORAL | 3 refills | Status: DC
Start: 2017-05-20 — End: 2018-05-05

## 2017-05-20 NOTE — Patient Instructions (Addendum)
Ms. Corporate treasurerCorsner--    HERE IS WHAT WE DISCUSSED TODAY:     Continue on the metoprolol 25mg  twice per day   Send us a remote transmission if you can during an anxiety attack. That way we can confirm that your heart rhythm is normal. I do suspect that the heart rhythm is normal during these times.    Come back in 12 months.    Please call the clinic if you have any questions.    Take care,  Gillian ShieldsElizabeth C. Wataru Mccowen, MD  Cardiology Fellow
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
## 2017-05-24 ENCOUNTER — Other Ambulatory Visit: Payer: Self-pay | Admitting: Cardiology

## 2017-05-24 DIAGNOSIS — R55 Syncope and collapse: Secondary | ICD-10-CM

## 2017-05-31 LAB — LOOP REMOTE CHARGEABLE DEVICE CHECK
Date Time Interrogation Session: 20190405204017
Implantable Pulse Generator Implant Date: 20180907000000

## 2017-06-19 ENCOUNTER — Ambulatory Visit
Admission: RE | Admit: 2017-06-19 | Discharge: 2017-06-19 | Disposition: A | Discharge status: Home / Self Care | Payer: Medicaid (Managed Care) | Source: Ambulatory Visit | Attending: Cardiology | Admitting: Cardiology

## 2017-06-19 DIAGNOSIS — R55 Syncope and collapse: Secondary | ICD-10-CM

## 2017-06-23 ENCOUNTER — Other Ambulatory Visit: Payer: Self-pay | Admitting: Cardiology

## 2017-06-23 DIAGNOSIS — R55 Syncope and collapse: Secondary | ICD-10-CM

## 2017-06-28 LAB — LOOP REMOTE CHARGEABLE DEVICE CHECK
Date Time Interrogation Session: 20190505213944
Implantable Pulse Generator Implant Date: 20180907000000

## 2017-07-19 ENCOUNTER — Ambulatory Visit
Admission: RE | Admit: 2017-07-19 | Discharge: 2017-07-19 | Disposition: A | Discharge status: Home / Self Care | Payer: Medicaid (Managed Care) | Source: Ambulatory Visit | Attending: Cardiology | Admitting: Cardiology

## 2017-07-19 DIAGNOSIS — R55 Syncope and collapse: Secondary | ICD-10-CM | POA: Insufficient documentation

## 2017-07-20 ENCOUNTER — Other Ambulatory Visit: Payer: Self-pay | Admitting: Cardiology

## 2017-07-20 DIAGNOSIS — R55 Syncope and collapse: Secondary | ICD-10-CM

## 2017-08-12 LAB — LOOP REMOTE CHARGEABLE DEVICE CHECK
Date Time Interrogation Session: 20190604213800
Implantable Pulse Generator Implant Date: 20180907000000

## 2017-08-18 ENCOUNTER — Ambulatory Visit
Admission: RE | Admit: 2017-08-18 | Discharge: 2017-08-18 | Disposition: A | Discharge status: Home / Self Care | Payer: Medicaid (Managed Care) | Source: Ambulatory Visit | Attending: Cardiology | Admitting: Cardiology

## 2017-08-18 DIAGNOSIS — R55 Syncope and collapse: Secondary | ICD-10-CM | POA: Insufficient documentation

## 2017-08-23 ENCOUNTER — Other Ambulatory Visit: Payer: Self-pay | Admitting: Cardiology

## 2017-08-23 DIAGNOSIS — R55 Syncope and collapse: Secondary | ICD-10-CM

## 2017-09-10 ENCOUNTER — Other Ambulatory Visit: Payer: Self-pay | Admitting: Cardiology

## 2017-09-10 ENCOUNTER — Emergency Department
Admission: EM | Admit: 2017-09-10 | Discharge: 2017-09-11 | Disposition: A | Discharge status: Home / Self Care | Payer: Medicaid (Managed Care) | Source: Ambulatory Visit | Attending: Emergency Medicine | Admitting: Emergency Medicine

## 2017-09-10 DIAGNOSIS — R002 Palpitations: Secondary | ICD-10-CM | POA: Insufficient documentation

## 2017-09-10 DIAGNOSIS — G43809 Other migraine, not intractable, without status migrainosus: Secondary | ICD-10-CM

## 2017-09-10 DIAGNOSIS — H53149 Visual discomfort, unspecified: Secondary | ICD-10-CM

## 2017-09-10 DIAGNOSIS — I499 Cardiac arrhythmia, unspecified: Secondary | ICD-10-CM

## 2017-09-10 DIAGNOSIS — R0602 Shortness of breath: Secondary | ICD-10-CM | POA: Insufficient documentation

## 2017-09-10 LAB — CBC AND DIFFERENTIAL
Baso # K/uL: 0 10*3/uL (ref 0.0–0.1)
Basophil %: 0.4 %
Eos # K/uL: 0.1 10*3/uL (ref 0.0–0.4)
Eosinophil %: 1 %
Hematocrit: 40 % (ref 34–45)
Hemoglobin: 12.7 g/dL (ref 11.2–15.7)
IMM Granulocytes #: 0 10*3/uL
IMM Granulocytes: 0.1 %
Lymph # K/uL: 1.7 10*3/uL (ref 1.2–3.7)
Lymphocyte %: 21.3 %
MCH: 29 pg/cell (ref 26–32)
MCHC: 32 g/dL (ref 32–36)
MCV: 91 fL (ref 79–95)
Mono # K/uL: 0.6 10*3/uL (ref 0.2–0.9)
Monocyte %: 7.5 %
Neut # K/uL: 5.4 10*3/uL (ref 1.6–6.1)
Nucl RBC # K/uL: 0 10*3/uL (ref 0.0–0.0)
Nucl RBC %: 0.1 /100 WBC (ref 0.0–0.2)
Platelets: 351 10*3/uL (ref 160–370)
RBC: 4.3 MIL/uL (ref 3.9–5.2)
RDW: 14.1 % (ref 11.7–14.4)
Seg Neut %: 69.7 %
WBC: 7.7 10*3/uL (ref 4.0–10.0)

## 2017-09-10 LAB — PLASMA PROF 7 (ED ONLY)
Anion Gap,PL: 13 (ref 7–16)
CO2,Plasma: 26 mmol/L (ref 20–28)
Chloride,Plasma: 104 mmol/L (ref 96–108)
Creatinine: 0.8 mg/dL (ref 0.51–0.95)
GFR,Black: 108 *
GFR,Caucasian: 94 *
Glucose,Plasma: 81 mg/dL (ref 60–99)
Potassium,Plasma: 3.8 mmol/L (ref 3.4–4.7)
Sodium,Plasma: 143 mmol/L (ref 133–145)
UN,Plasma: 12 mg/dL (ref 6–20)

## 2017-09-10 LAB — PREGNANCY TEST, SERUM: Preg,Serum: NEGATIVE

## 2017-09-10 LAB — TSH: TSH: 1.53 u[IU]/mL (ref 0.27–4.20)

## 2017-09-10 LAB — HOLD BLUE

## 2017-09-10 MED ORDER — SODIUM CHLORIDE 0.9 % IV BOLUS *I*
1000.0000 mL | Freq: Once | Status: AC
Start: 2017-09-10 — End: 2017-09-11
  Administered 2017-09-10: 1000 mL via INTRAVENOUS

## 2017-09-10 MED ORDER — DEXTROSE 5 % FLUSH FOR PUMPS *I*
0.0000 mL/h | INTRAVENOUS | Status: DC | PRN
Start: 2017-09-10 — End: 2017-09-11

## 2017-09-10 MED ORDER — SODIUM CHLORIDE 0.9 % FLUSH FOR PUMPS *I*
0.0000 mL/h | INTRAVENOUS | Status: DC | PRN
Start: 2017-09-10 — End: 2017-09-11

## 2017-09-10 MED ORDER — SODIUM CHLORIDE 0.9 % IV BOLUS *I*
1000.0000 mL | Freq: Once | Status: AC
Start: 2017-09-10 — End: 2017-09-10
  Administered 2017-09-10: 1000 mL via INTRAVENOUS

## 2017-09-10 MED ORDER — SODIUM CHLORIDE 0.9 % 100 ML IV SOLN *I*
5.0000 mg | Freq: Once | INTRAVENOUS | Status: AC
Start: 2017-09-10 — End: 2017-09-11
  Administered 2017-09-10: 5 mg via INTRAVENOUS
  Filled 2017-09-10: qty 2

## 2017-09-10 MED ORDER — KETOROLAC TROMETHAMINE 30 MG/ML IJ SOLN *I*
15.0000 mg | Freq: Once | INTRAMUSCULAR | Status: AC
Start: 2017-09-10 — End: 2017-09-10
  Administered 2017-09-10: 15 mg via INTRAVENOUS
  Filled 2017-09-10: qty 1

## 2017-09-10 MED ORDER — DIPHENHYDRAMINE HCL 50 MG/ML IJ SOLN *I*
50.0000 mg | Freq: Once | INTRAMUSCULAR | Status: AC
Start: 2017-09-10 — End: 2017-09-10
  Administered 2017-09-10: 50 mg via INTRAVENOUS
  Filled 2017-09-10: qty 1

## 2017-09-10 NOTE — ED Triage Notes (Signed)
Sudden onset of just not feeling well. Endorsing shortness of breath and palpations. Has loop recorder. Previous cardiac ablations. Also complaints of Migraine like headache since Tuesday evening. EKG in triage.        Triage Note   Retta MacBryanna Shyler Hamill, RN

## 2017-09-10 NOTE — ED Notes (Signed)
Pt arrived to ED with c/o HA since Tuesday. Pt took medication from home to relieve and was feeling better today until when at [employer] pt states she suddenly felt generally sick with nausea, sweats, and headache. IV placed, tele placed, A&Ox4 and ambulatory.

## 2017-09-10 NOTE — First Provider Contact (Signed)
ED First Provider Contact Note    Initial provider evaluation performed by   ED First Provider Contact     Date/Time Event User Comments    09/10/17 1641 ED First Provider Contact Josseline Reddin L Initial Face to Face Provider Contact        Pt is a 37 y.o. white female co sob and palpitations.pt has missed work because of her symptoms, pt denies vomiting  Has a migraine   Vital signs reviewed.    Orders placed:  EKG and LABS     Patient requires further evaluation.     Lundy Cozart, PA, 09/10/2017, 4:41 PM     Alba CoryMapes, Caulin Begley, [place]  09/10/17 1644

## 2017-09-10 NOTE — ED Notes (Signed)
Assumed care of patient, received report from RN, will continue to monitor and treat per provider orders
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
## 2017-09-11 ENCOUNTER — Encounter: Payer: Self-pay | Admitting: Student in an Organized Health Care Education/Training Program

## 2017-09-11 MED ORDER — SODIUM CHLORIDE 0.9 % 100 ML IV SOLN *I*
5.0000 mg | Freq: Once | INTRAVENOUS | Status: AC
Start: 2017-09-11 — End: 2017-09-11
  Administered 2017-09-11: 5 mg via INTRAVENOUS
  Filled 2017-09-11: qty 2

## 2017-09-11 MED ORDER — ONDANSETRON 4 MG PO TBDP *I*
4.0000 mg | ORAL_TABLET | Freq: Three times a day (TID) | ORAL | 0 refills | Status: DC | PRN
Start: 2017-09-11 — End: 2018-03-27

## 2017-09-11 NOTE — ED Provider Progress Notes (Signed)
ED Provider Progress Note    Received sign-out from evening team.     In short, 37 y/o with AVNRT here with palpitations and migraine headache. Patient did not have noted tachycardia while here and given HA cocktail (benadryl, toradol, compazine).     Work-up here has been unremarkable.    On re-evaluation, patient reports mild improvement but still persistent headache- wishes to go home. Will give another 5mg  compazine, and have patient continue follow-up with her existing physicians.    Return precautions discussed.    Patient will be discharged.        Threasa BeardsAlbert Emberlin Verner, MD, 09/11/2017, 1:55 AM     Threasa BeardsShih, Makaylyn Sinyard, MD  Resident  09/11/17 [phone]

## 2017-09-11 NOTE — ED Notes (Signed)
ED RN INTERN ATTESTATION       I Thelma BargeHeather Earle, RN (RN) reviewed the following charting information by the RN intern: Estevan RyderJuanita Wilson    Nursing Assessments  Medications  Plan of Care  Teaching   Notes    In the chart of Danielle Hall [age] y.o. female) and attest to the charting being accurate.

## 2017-09-11 NOTE — ED Notes (Signed)
Patient ambulatory for discharge. Vital signs stable, pt is safe for discharge. IV site removed from patient. Discharge teaching and follow up care reviewed. Patient verbalized understanding of discharge instructions. To follow up with provider, proper clothing in place. Provider aware of pt BP, asymptomatic. Family is driving pt home.

## 2017-09-11 NOTE — Discharge Instructions (Signed)
Your blood work and EKG were negative in the ED today, you had symptoms consistent of a migraine that somewhat improved after compazine.    Drink plenty of water at home and take zofran as prescribed if needed for nausea/vomiting as prescribed.    Return to the ED with high fever, repeated vomiting, worsening pain, mental status changes, or if there is anything else you find concerning.

## 2017-09-12 LAB — EKG 12-LEAD
P: 63 deg
PR: 152 ms
QRS: 42 deg
QRSD: 96 ms
QT: 396 ms
QTc: 460 ms
Rate: 81 {beats}/min
T: 49 deg

## 2017-09-12 LAB — LOOP REMOTE CHARGEABLE DEVICE CHECK
Date Time Interrogation Session: 20190704223753
Implantable Pulse Generator Implant Date: 20180907000000

## 2017-09-17 ENCOUNTER — Ambulatory Visit
Admission: RE | Admit: 2017-09-17 | Discharge: 2017-09-17 | Disposition: A | Discharge status: Home / Self Care | Payer: Medicaid (Managed Care) | Source: Ambulatory Visit | Attending: Cardiology | Admitting: Cardiology

## 2017-09-17 DIAGNOSIS — R55 Syncope and collapse: Secondary | ICD-10-CM

## 2017-09-21 ENCOUNTER — Other Ambulatory Visit: Payer: Self-pay | Admitting: Cardiology

## 2017-09-21 DIAGNOSIS — R55 Syncope and collapse: Secondary | ICD-10-CM

## 2017-10-04 LAB — LOOP REMOTE CHARGEABLE DEVICE CHECK
Date Time Interrogation Session: 20190803223737
Implantable Pulse Generator Implant Date: 20180907000000

## 2017-10-17 ENCOUNTER — Ambulatory Visit
Admission: RE | Admit: 2017-10-17 | Discharge: 2017-10-17 | Disposition: A | Discharge status: Home / Self Care | Payer: Medicaid (Managed Care) | Source: Ambulatory Visit | Attending: Cardiology | Admitting: Cardiology

## 2017-10-17 DIAGNOSIS — R55 Syncope and collapse: Secondary | ICD-10-CM

## 2017-10-20 ENCOUNTER — Other Ambulatory Visit: Payer: Self-pay | Admitting: Cardiology

## 2017-10-20 DIAGNOSIS — R55 Syncope and collapse: Secondary | ICD-10-CM

## 2017-10-21 LAB — LOOP REMOTE CHARGEABLE DEVICE CHECK
Date Time Interrogation Session: 20190902223955
Implantable Pulse Generator Implant Date: 20180907000000

## 2017-11-16 ENCOUNTER — Ambulatory Visit
Admission: RE | Admit: 2017-11-16 | Discharge: 2017-11-16 | Disposition: A | Discharge status: Home / Self Care | Payer: Medicaid (Managed Care) | Source: Ambulatory Visit | Attending: Cardiology | Admitting: Cardiology

## 2017-11-16 DIAGNOSIS — R55 Syncope and collapse: Secondary | ICD-10-CM

## 2017-11-17 ENCOUNTER — Other Ambulatory Visit: Payer: Self-pay | Admitting: Cardiology

## 2017-11-17 DIAGNOSIS — R55 Syncope and collapse: Secondary | ICD-10-CM

## 2017-11-18 LAB — LOOP REMOTE CHARGEABLE DEVICE CHECK
Date Time Interrogation Session: 20191002230948
Implantable Pulse Generator Implant Date: 20180907000000

## 2017-12-16 ENCOUNTER — Ambulatory Visit
Admission: RE | Admit: 2017-12-16 | Discharge: 2017-12-16 | Disposition: A | Discharge status: Home / Self Care | Payer: Medicaid (Managed Care) | Source: Ambulatory Visit | Attending: Cardiology | Admitting: Cardiology

## 2017-12-16 DIAGNOSIS — R55 Syncope and collapse: Secondary | ICD-10-CM

## 2017-12-20 ENCOUNTER — Other Ambulatory Visit: Payer: Self-pay | Admitting: Cardiology

## 2017-12-20 DIAGNOSIS — R55 Syncope and collapse: Secondary | ICD-10-CM

## 2017-12-22 LAB — LOOP REMOTE CHARGEABLE DEVICE CHECK
Date Time Interrogation Session: 20191101234101
Implantable Pulse Generator Implant Date: 20180907000000

## 2018-01-15 ENCOUNTER — Ambulatory Visit
Admission: RE | Admit: 2018-01-15 | Discharge: 2018-01-15 | Disposition: A | Discharge status: Home / Self Care | Payer: Medicaid (Managed Care) | Source: Ambulatory Visit | Attending: Cardiology | Admitting: Cardiology

## 2018-01-15 DIAGNOSIS — R55 Syncope and collapse: Secondary | ICD-10-CM

## 2018-01-20 ENCOUNTER — Other Ambulatory Visit: Payer: Self-pay | Admitting: Cardiology

## 2018-01-20 DIAGNOSIS — R55 Syncope and collapse: Secondary | ICD-10-CM

## 2018-02-06 LAB — LOOP REMOTE CHARGEABLE DEVICE CHECK
Date Time Interrogation Session: 20191201234053
Implantable Pulse Generator Implant Date: 20180907000000

## 2018-02-14 ENCOUNTER — Ambulatory Visit
Admission: RE | Admit: 2018-02-14 | Discharge: 2018-02-14 | Disposition: A | Discharge status: Home / Self Care | Payer: Medicaid (Managed Care) | Source: Ambulatory Visit | Attending: Cardiology | Admitting: Cardiology

## 2018-02-14 DIAGNOSIS — R55 Syncope and collapse: Secondary | ICD-10-CM

## 2018-02-20 ENCOUNTER — Other Ambulatory Visit: Payer: Self-pay | Admitting: Cardiology

## 2018-02-20 DIAGNOSIS — R55 Syncope and collapse: Secondary | ICD-10-CM

## 2018-02-21 LAB — LOOP REMOTE CHARGEABLE DEVICE CHECK
Brady: 0
Date Time Interrogation Session: 20200105062912
Implantable Pulse Generator Implant Date: 20180907000000
Pause: 0
Recent AFIB Episodes: 0
Recent ATACH Episodes: 0
Recent VT1 Episodes: 0
Symptom: 0
VT1 Detection Rate: 194

## 2018-02-28 ENCOUNTER — Telehealth: Payer: Self-pay | Admitting: Cardiology

## 2018-02-28 ENCOUNTER — Other Ambulatory Visit: Payer: Self-pay | Admitting: Cardiology

## 2018-02-28 DIAGNOSIS — I472 Ventricular tachycardia: Secondary | ICD-10-CM

## 2018-02-28 DIAGNOSIS — I471 Supraventricular tachycardia: Secondary | ICD-10-CM

## 2018-02-28 DIAGNOSIS — I4729 Other ventricular tachycardia: Secondary | ICD-10-CM
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
## 2018-02-28 NOTE — Telephone Encounter (Signed)
Patient is calling to follow up on an emergency room visit yesterday at [facility].  She has a loop recorder and her heart rate elevated to 180, she felt dizzy, nauseous, etc.  Was advised to follow up with Dr. Margo Aye.  By the time she actually got to the emergency room her heart rate was down again in the 90's.

## 2018-03-01 ENCOUNTER — Ambulatory Visit
Admission: RE | Admit: 2018-03-01 | Discharge: 2018-03-01 | Disposition: A | Discharge status: Home / Self Care | Payer: Medicaid (Managed Care) | Source: Ambulatory Visit | Attending: Cardiology | Admitting: Cardiology

## 2018-03-01 DIAGNOSIS — I472 Ventricular tachycardia: Secondary | ICD-10-CM

## 2018-03-01 DIAGNOSIS — I471 Supraventricular tachycardia: Secondary | ICD-10-CM | POA: Insufficient documentation

## 2018-03-01 DIAGNOSIS — I4729 Other ventricular tachycardia: Secondary | ICD-10-CM

## 2018-03-01 LAB — ECHO COMPLETE
Aortic Arch Diameter: 2.2 cm
Aortic Diameter (mid tubular): 2.7 cm
Aortic Diameter (sinus of Valsalva): 2.9 cm
BMI: 24 kg/m2
BSA: 1.65 m2
Deceleration Time - MV: 169 ms
E/A ratio: 1.4
Heart Rate: 60 {beats}/min
Height: 62.992 in
IVC Diameter: 1.3 cm
LA Diameter BSA Index: 2.1 cm/m2
LA Diameter Height Index: 2.1 cm/m
LA Diameter: 3.4 cm
LA Systolic Vol BSA Index: 14.5 mL/m2
LA Systolic Vol Height Index: 15 mL/m
LA Systolic Volume: 24 mL
LV ASE Mass BSA Index: 79.5 gm/m2
LV ASE Mass Height 2.7 Index: 36.9 gm/m2.7
LV ASE Mass Height Index: 82 gm/m
LV ASE Mass: 131.2 gm
LV Posterior Wall Thickness: 0.8 cm
LV Septal Thickness: 0.8 cm
LVED Diameter BSA Index: 3 cm/m2
LVED Diameter Height Index: 3.1 cm/m
LVED Diameter: 4.9 cm
LVES Diameter BSA Index: 2.1 cm/m2
LVES Diameter Height Index: 2.1 cm/m
LVES Diameter: 3.4 cm
LVOT Area (calculated): 3.23 cm2
LVOT Cardiac Index: 2.14 L/min/m2
LVOT Cardiac Output: 3.53 L/min
LVOT Diameter: 2.03 cm
LVOT PWD VTI: 18.2 cm
LVOT PWD Velocity (mean): 56.4 cm/s
LVOT PWD Velocity (peak): 90.9 cm/s
LVOT SV BSA Index: 35.68 mL/m2
LVOT SV Height Index: 36.8 mL/m
LVOT Stroke Rate (mean): 182.4 mL/s
LVOT Stroke Rate (peak): 294.1 mL/s
LVOT Stroke Volume: 58.88 cc
MV Peak A Velocity: 37.3 cm/s
MV Peak E Velocity: 52.4 cm/s
Mitral Annular E/Ea Vel Ratio: 5.04
Mitral Annular Ea Velocity: 10.39 cm/s
Peak Gradient - TR: 17.6 mmHg
Peak Velocity - TR: 209.57 cm/s
RA Pressure Estimate: 6 mmHg
RA Volume BSA Index: 13.9 mL/m2
RA Volume Height Index: 14.4 mL/m
RA Volume: 23 mL
RR Interval: 1000 ms
RV Peak Systolic Pressure: 23.6 mmHg
Weight (lbs): 134.92 [lb_av]
Weight: 2158.74 oz

## 2018-03-04 ENCOUNTER — Encounter: Payer: Self-pay | Admitting: Student in an Organized Health Care Education/Training Program

## 2018-03-04 ENCOUNTER — Emergency Department
Admission: EM | Admit: 2018-03-04 | Discharge: 2018-03-04 | Disposition: A | Discharge status: Home / Self Care | Payer: Medicaid (Managed Care) | Source: Ambulatory Visit | Attending: Emergency Medicine | Admitting: Emergency Medicine

## 2018-03-04 ENCOUNTER — Other Ambulatory Visit: Payer: Self-pay | Admitting: Cardiology

## 2018-03-04 ENCOUNTER — Emergency Department: Payer: Medicaid (Managed Care)

## 2018-03-04 DIAGNOSIS — R002 Palpitations: Secondary | ICD-10-CM | POA: Insufficient documentation

## 2018-03-04 DIAGNOSIS — R079 Chest pain, unspecified: Secondary | ICD-10-CM

## 2018-03-04 DIAGNOSIS — R0602 Shortness of breath: Secondary | ICD-10-CM

## 2018-03-04 DIAGNOSIS — I499 Cardiac arrhythmia, unspecified: Secondary | ICD-10-CM

## 2018-03-04 DIAGNOSIS — H53149 Visual discomfort, unspecified: Secondary | ICD-10-CM

## 2018-03-04 DIAGNOSIS — R5381 Other malaise: Secondary | ICD-10-CM | POA: Insufficient documentation

## 2018-03-04 DIAGNOSIS — R42 Dizziness and giddiness: Secondary | ICD-10-CM | POA: Insufficient documentation

## 2018-03-04 LAB — CBC AND DIFFERENTIAL
Baso # K/uL: 0 10*3/uL (ref 0.0–0.1)
Basophil %: 0.5 %
Eos # K/uL: 0.1 10*3/uL (ref 0.0–0.4)
Eosinophil %: 1.5 %
Hematocrit: 39 % (ref 34–45)
Hemoglobin: 13 g/dL (ref 11.2–15.7)
IMM Granulocytes #: 0 10*3/uL
IMM Granulocytes: 0.2 %
Lymph # K/uL: 1.6 10*3/uL (ref 1.2–3.7)
Lymphocyte %: 24.7 %
MCH: 29 pg/cell (ref 26–32)
MCHC: 33 g/dL (ref 32–36)
MCV: 88 fL (ref 79–95)
Mono # K/uL: 0.5 10*3/uL (ref 0.2–0.9)
Monocyte %: 7 %
Neut # K/uL: 4.4 10*3/uL (ref 1.6–6.1)
Nucl RBC # K/uL: 0 10*3/uL (ref 0.0–0.0)
Nucl RBC %: 0 /100 WBC (ref 0.0–0.2)
Platelets: 262 10*3/uL (ref 160–370)
RBC: 4.5 MIL/uL (ref 3.9–5.2)
RDW: 13.5 % (ref 11.7–14.4)
Seg Neut %: 66.1 %
WBC: 6.6 10*3/uL (ref 4.0–10.0)

## 2018-03-04 LAB — BLOOD BANK HOLD RED

## 2018-03-04 LAB — PLASMA PROF 7 (ED ONLY)
Anion Gap,PL: 14 (ref 7–16)
CO2,Plasma: 21 mmol/L (ref 20–28)
Chloride,Plasma: 105 mmol/L (ref 96–108)
Creatinine: 0.73 mg/dL (ref 0.51–0.95)
GFR,Black: 121 *
GFR,Caucasian: 105 *
Glucose,Plasma: 97 mg/dL (ref 60–99)
Potassium,Plasma: 3.8 mmol/L (ref 3.4–4.7)
Sodium,Plasma: 140 mmol/L (ref 133–145)
UN,Plasma: 16 mg/dL (ref 6–20)

## 2018-03-04 LAB — MAGNESIUM: Magnesium: 2.1 mg/dL (ref 1.6–2.5)

## 2018-03-04 LAB — CALCIUM: Calcium: 9.4 mg/dL (ref 8.8–10.2)

## 2018-03-04 LAB — PHOSPHORUS: Phosphorus: 2.8 mg/dL (ref 2.7–4.5)

## 2018-03-04 LAB — HOLD BLUE

## 2018-03-04 LAB — TSH: TSH: 1.47 u[IU]/mL (ref 0.27–4.20)

## 2018-03-04 LAB — HOLD GREEN WITH GEL

## 2018-03-04 LAB — BLOOD BANK HOLD LAVENDER

## 2018-03-04 LAB — HOLD SST

## 2018-03-04 LAB — T4, FREE: Free T4: 1.4 ng/dL (ref 0.9–1.7)

## 2018-03-04 LAB — BHCG, QUANT PREGNANCY: BHCG, QUANT PREGNANCY: <1 m[IU]/mL (ref 0–1)

## 2018-03-04 NOTE — Discharge Instructions (Addendum)
You were seen in the emergency department for palpitations and irregular heart beat. We did blood work, an x-ray, interrogated your device, and observed you for several hours. Everything was normal. We also asked cardiology to come see you and they did. They also believe you are safe for discharge. They made adjustments to your device setting and recommended that you follow up in 2 weeks in clinic with them.     Please return to the emergency department if you have chest pain, shortness of breath, fast heart beat, or any new or concerning symptoms.

## 2018-03-04 NOTE — ED Triage Notes (Signed)
Pt presents from work and c/o "not feeling right" and chest palpitations.  Had similar episodes on Monday.  Significant cardiac hx, has a medtronic device in place.  Hx Vtach, SVT and Afib.  EKG in triage       Triage Note   Yomaira Solar, RN

## 2018-03-04 NOTE — ED Notes (Addendum)
Pt very frustrated with care she is receiving, pt feels as though noone is listening. Writer provided therapeutic communication and listened to pt. Charge RN and CRN made aware.

## 2018-03-04 NOTE — Plan of Care (Signed)
38 YO F with Hx of NSVT and AVNRT s/p Ablation presented with episodes of palpitations  Her EKG and Telemetry showed normal sinus rhythm, she states that her rate was 150 b/min   During the episode earlier today at work according to her apple watch, she took pictures of rhythm strips on her watch that's not reliable but does show irregularity.    Her physical exam and lab work is benign  Her Loop recorder interrogation was negative  I lowered her tachycardia detection rate from 194 b/min to 150 b/min  Also recommended using Kardia device to check rhythm   She will follow up with me and Dr. Thersa Salt in 2 weeks and have appointment with Dr. Renaldo Reel on 02/10  From cardiology standpoint there is no further work up required    [place].B.Ch.B  Fellow - Cardiovascular Medicine  [facility], [place]

## 2018-03-04 NOTE — ED Notes (Signed)
ED RN INTERN ATTESTATION       I Tivis Ringer, RN (RN) reviewed the following charting information by the RN intern: Lovett Sox, RN     Nursing Assessments  Medications  Plan of Care  Teaching   Notes    In the chart of Danielle Hall (38 y.o. female) and attest to the charting being accurate.

## 2018-03-04 NOTE — ED Notes (Signed)
Report Given To  Herbert Seta, RN       Descriptive Sentence / Reason for Admission   Pt states she was at work and began to feel dizzy and felt palpitations. Pt listened to herself with a stethoscope, heard her  Heart skip a beat, and her vision went blurry, palms were diaphoretic, and pt felt nausea. Pt denies chest pain, shortness of breath. Pt states her metoprolol dose was increased on Monday after a period of tachycardia. Pt hx afib during pregnancy, AV nodal re-entry tachycardia.      Active Issues / Relevant Events   - Palpitations/dizziness  - A&Ox4  - Ambulatory   - Tele       To Do List  - VS/A q4hr  - Medicate per mar   - Pain management         Anticipatory Guidance / Discharge Planning  - Pending

## 2018-03-04 NOTE — ED Notes (Signed)
Pt up and ambulatory without assistance. Pt tolerating PO intake. Pt discharge instructions reviewed. Pt comfortable with discharge planning and verbalizes understanding of discharge instruction. Pt will follow up with PCP. Pt belongings with pt. Pt safe to discharge at this time. Pt leaving to home via her family driving her. Pt has access to residence and is dressed appropriately for the weather.

## 2018-03-04 NOTE — ED Provider Notes (Addendum)
History     Chief Complaint   Patient presents with    Palpitations     Danielle Hall is a 38 y.o. female with a PMH s/f AVNRT, NST Ventricular tachycardia, Atrial fibrillation s/p ablation 10/2016, DVT, and Depression who is presenting with a c/c of palpitations. Her symptoms started on Monday when she was driving home, she felt an irregular heart beat, dyspnea, and malaise. She pulled over and saw her heart was at 150. She was taken to an OSH were no abnormalities were found except for a potassium of 3.3 where she was given 40 mEq of oral potassium. Her remaining labs including cardiac enzymes were normal and she was sent home with a medication adjustment, specifically her metoprolol increased from 25 mg to 37.5 BID. This medication adjustment was made after consulting electrophysiology and cardiology at [facility]. She has follow up with her outpatient PCP on 1/22 and with her electrophysiologist on 2/10. Since Monday, she endorses feeling malaise. She endorses palpitations, irregular beats, and malaise. She is presenting today because she was listening to her heart beat and heard it skip it beat as well as had associated visual disturbance, lightheadedness, and pallor. She was at work, as a Engineer, civil (consulting) in a PCP office, and the physician felt her pulse and thought it was irregular. Despite many individuals agreeing with her and examining her while at work no one got an EKG to capture her irregular heart rhythm. She was transported by EMS and received a R PIV, 4 mg zofran, and 324 of chewable ASA, and fluids. Here, she is not complaining of any symptoms and is doing well. She does endorse significant anxiety given her past experience with NST ventricular tachycardia.     Echo: 03/01/2018   Normal LVEF without significant regional wall motion abnormalities. No significant valvular abnormalities. Normal right heart size and function with estimated normal RV systolic pressures. Most recent loop recorder data was 02/19/2018. No  tachydysrhythmia data. One pause.           Medical/Surgical/Family History     Past Medical History:   Diagnosis Date    Atrial fibrillation     AVNRT (AV nodal re-entry tachycardia)     Chalazion of both eyes     Depression     DVT (deep venous thrombosis) 2006    post knee surgery    Dysrhythmia         Patient Active Problem List   Diagnosis Code    A-fib I48.91    DVT (deep venous thrombosis) I82.409    AVNRT (AV nodal re-entry tachycardia) I47.1    Chalazion of both eyes H00.13, H00.16            Past Surgical History:   Procedure Laterality Date    CARDIAC ELECTROPHYSIOLOGY PROCEDURE N/A 10/21/2016    Procedure: Ablation - SVT;  Surgeon: Zetta Bills, MD;  Location: [facility] EP LABS;  Service:     HERNIA REPAIR      KNEE SURGERY      PR IMPLANTATION PT-ACTIVATED CARDIAC EVENT RECORDER N/A 10/22/2016    Procedure: Loop Recorder Implant;  Surgeon: Astrid Divine, MD;  Location: [facility] EP LABS;  Service: Cardiovascular    TUBAL LIGATION       Family History   Problem Relation Age of Onset    Arrhythmia Paternal Grandmother     Heart Disease Paternal Grandfather     No Known Problems Mother     No Known Problems Father     No  Known Problems Sister     No Known Problems Brother     No Known Problems Maternal Aunt     No Known Problems Maternal Uncle     No Known Problems Paternal Aunt     No Known Problems Paternal Uncle     No Known Problems Maternal Grandmother     No Known Problems Maternal Grandfather     No Known Problems Other     Sudden death Neg Hx     Amblyopia (Lazy Eye) Neg Hx     Blindness Neg Hx     Cataracts Neg Hx     Diabetes Neg Hx     Glaucoma Neg Hx     Macular degeneration Neg Hx     Retinal detachment Neg Hx     Strabismus Neg Hx           Social History     Tobacco Use    Smoking status: Never Smoker    Smokeless tobacco: Never Used   Substance Use Topics    Alcohol use: Yes     Comment: socially    Drug use: No     Living Situation     Questions Responses     Patient lives with Family    Homeless No    Caregiver for other family member No    External Services None    Employment Employed    Domestic Violence Risk No                Review of Systems   Review of Systems   Constitutional: Negative for chills and fever.   HENT: Negative for congestion.    Eyes: Positive for visual disturbance.   Respiratory: Positive for shortness of breath.    Cardiovascular: Positive for palpitations. Negative for chest pain.   Gastrointestinal: Negative for abdominal pain, diarrhea, nausea and vomiting.   Genitourinary: Negative for difficulty urinating and dysuria.   Musculoskeletal: Negative for myalgias.   Skin: Negative for rash.   Neurological: Positive for light-headedness. Negative for dizziness and headaches.   All other systems reviewed and are negative.      Physical Exam     Triage Vitals  Triage Start: Start, (03/04/18 1333)   First Recorded BP: 127/69, Resp: 14, Temp: 36.7 C (98.1 F) Oxygen Therapy SpO2: 100 %, O2 Device: None (Room air), Heart Rate: 78, (03/04/18 1334)  .  First Pain Reported  0-10 Scale: 0, (03/04/18 1334)       Physical Exam  Vitals signs and nursing note reviewed.   Constitutional:       General: She is not in acute distress.     Appearance: Normal appearance. She is not ill-appearing, toxic-appearing or diaphoretic.   HENT:      Head: Atraumatic.   Eyes:      Pupils: Pupils are equal, round, and reactive to light.   Cardiovascular:      Rate and Rhythm: Normal rate and regular rhythm.      Chest Wall: PMI is not displaced.      Pulses: Normal pulses. No decreased pulses.      Heart sounds: Normal heart sounds, S1 normal and S2 normal. Heart sounds not distant. No murmur. No friction rub. No gallop.    Pulmonary:      Effort: Pulmonary effort is normal. No respiratory distress.      Breath sounds: Normal breath sounds. No stridor. No rales.   Chest:      Chest wall:  No tenderness.   Abdominal:      General: Bowel sounds are normal. There is no  distension.      Palpations: Abdomen is soft.   Musculoskeletal:      Right lower leg: No edema.      Left lower leg: No edema.   Skin:     General: Skin is warm and dry.      Capillary Refill: Capillary refill takes less than 2 seconds.   Neurological:      General: No focal deficit present.      Mental Status: She is alert and oriented to person, place, and time.         Medical Decision Making   Patient seen by me on:  03/04/2018    Assessment:  Danielle Hall is a 38 y.o. female with history of atrial fibrillation, NST vtach, and AVNRT who is presenting with palpitations, lightheadedness, and malaise concerning for possible dysrhythmia.    Differential diagnosis:  Tachydysrhythmia  Tachybrady syndrome  Electrolyte abnormality  Anemia  PNA  Pericardial effusion  Device malfunction      Plan:  Orders Placed This Encounter      *Chest standard frontal and lateral views      CBC and differential      Plasma profile 7 (ED only)      TSH      T4, free      Magnesium      Phosphorus      Calcium      Hold blue      Hold SST      Blood bank hold lavender      Blood bank hold red      Hold green with gel      HCG, serum qualitative, pregnancy      BHCG, quant pregnancy      Continuous telemetry, non-protocol    Medications - No data to display  Consults: Cardiology      EKG Interpretation: NSR, Rate 77, no ischemic changes    Independent review of: Existing labs, chart/prior records    ED Course and Disposition:  Pt appears in NAD. She appears anxious but stable.  Patient's device was interrogated which did not show any significant events.  Patient's lab work and imaging were within normal limits.  Patient was observed for several hours without any evidence of dysrhythmia.  We discussed findings with patient who did not seem to accept the fact that she was safe for discharge and outpatient follow-up with cardiology.  She continued to display very anxious mood.  Thus, cardiology was consulted, recommended that patient keep  her appointment in 2 weeks, and felt patient was safe for discharge. They adjusted her medtronic device settings to sense a heart rate of greater than 140.    Disposition: Patient discharged safely home with cardiology follow-up in 2 weeks.       ED Course as of Mar 04 2312   Sat Mar 04, 2018   1443 BP: 127/69   1443 Temp: 36.7 C (98.1 F)   1443 Heart Rate: 78   1443 Resp: 14   1443 SpO2: 100 %   1624 No acute cardiopulmonary disease.     *Chest standard frontal and lateral views   1624 BHCG, QUANT PREGNANCY: <1   1625 Phosphorus: 2.8   1625 Calcium: 9.4       Dalbert Batman, MD    Resident Attestation:    Patient seen by me on 03/04/2018.  History:  I reviewed this patient, reviewed the resident's note and agree.    Exam:  I examined this patient, reviewed the resident's note and agree.    Decision Making:  I discussed with the resident his/her documented decision making and agree.      Author:  Brain Hilts, MD       Dalbert Batman, MD  Resident  03/04/18 2320       Brain Hilts, MD  03/05/18 [phone]

## 2018-03-04 NOTE — ED Notes (Signed)
Assumed care of pt. Pt resting comfortably with call bell within reach. Telemetry reading NSR. Will continue to monitor and treat pt per provider's orders.

## 2018-03-04 NOTE — ED Notes (Signed)
Cardiology at the bedside.

## 2018-03-04 NOTE — ED Notes (Signed)
Writer checked on pt. Pt denies any needs currently. Pt relations card provided to pt by CRN.

## 2018-03-04 NOTE — ED Notes (Addendum)
Pt states she was at work and began to feel dizzy and felt palpitations. Pt listened to herself with a stethoscope, heard her  Heart skip a beat, and her vision went blurry, palms were diaphoretic, and pt felt nausea. Pt denies chest pain, shortness of breath. Pt states her metoprolol dose was increased on Monday after a period of tachycardia. Pt hx afib during pregnancy, AV nodal re-entry tachycardia.

## 2018-03-07 LAB — EKG 12-LEAD
P: 72 deg
PR: 160 ms
QRS: 60 deg
QRSD: 94 ms
QT: 382 ms
QTc: 433 ms
Rate: 77 {beats}/min
T: 60 deg

## 2018-03-08 ENCOUNTER — Ambulatory Visit
Admission: RE | Admit: 2018-03-08 | Discharge: 2018-03-08 | Disposition: A | Discharge status: Home / Self Care | Payer: Medicaid (Managed Care) | Source: Ambulatory Visit | Attending: Family Medicine | Admitting: Family Medicine

## 2018-03-08 ENCOUNTER — Other Ambulatory Visit: Payer: Self-pay | Admitting: Family Medicine

## 2018-03-08 DIAGNOSIS — R002 Palpitations: Secondary | ICD-10-CM

## 2018-03-09 ENCOUNTER — Other Ambulatory Visit: Payer: Self-pay | Admitting: Cardiology

## 2018-03-09 ENCOUNTER — Ambulatory Visit
Admit: 2018-03-09 | Discharge: 2018-03-09 | Disposition: A | Discharge status: Home / Self Care | Payer: Medicaid (Managed Care) | Attending: Cardiology | Admitting: Cardiology

## 2018-03-09 DIAGNOSIS — R55 Syncope and collapse: Secondary | ICD-10-CM

## 2018-03-15 LAB — HOLTER MONITOR - 48 HOUR
AF Count: 0
Analysis Time: 0:0 {titer}
Analyze Time Length Hours: 48
Analyze Time Length Minutes: 33
Bigeminy: 0
Bradycardia Runs: 0
Couplet: 0
Max Heart Rate: 115
Mean Heart Rate: 74
Min Heart Rate: 53
Pauses: 0
R on T: 0
SVE Max Per Hour Time: 17:0 {titer}
SVE Max Per Hour: 1
SVE Percent Beats: 0 %
SVE Total Beats: 3
SVE couplet: 0
SVT Runs: 0
Tachycardia Longest Run: 70
Tachycardia Max Rate: 128
Tachycardia Runs: 12
Total Beats: 214781
Trigeminy: 0
Triplet: 0
VE Max Per Hour Time: 23:0 {titer}
VE Max Per Hour: 2
VE Percent Beats: 0 %
VE Total Beats: 4
VT Runs: 0

## 2018-03-16 ENCOUNTER — Ambulatory Visit
Admission: RE | Admit: 2018-03-16 | Discharge: 2018-03-16 | Disposition: A | Discharge status: Home / Self Care | Payer: Medicaid (Managed Care) | Source: Ambulatory Visit | Attending: Cardiology | Admitting: Cardiology

## 2018-03-16 DIAGNOSIS — R55 Syncope and collapse: Secondary | ICD-10-CM

## 2018-03-17 ENCOUNTER — Other Ambulatory Visit: Payer: Self-pay | Admitting: Cardiology

## 2018-03-17 ENCOUNTER — Ambulatory Visit: Payer: Self-pay | Admitting: Student in an Organized Health Care Education/Training Program

## 2018-03-17 DIAGNOSIS — R55 Syncope and collapse: Secondary | ICD-10-CM

## 2018-03-27 ENCOUNTER — Ambulatory Visit
Admission: RE | Admit: 2018-03-27 | Discharge: 2018-03-27 | Disposition: A | Discharge status: Home / Self Care | Payer: Medicaid (Managed Care) | Source: Ambulatory Visit | Attending: Cardiology | Admitting: Cardiology

## 2018-03-27 ENCOUNTER — Other Ambulatory Visit: Payer: Self-pay | Admitting: Cardiology

## 2018-03-27 ENCOUNTER — Encounter: Payer: Self-pay | Admitting: Cardiology

## 2018-03-27 ENCOUNTER — Ambulatory Visit: Payer: Medicaid (Managed Care) | Attending: Cardiology | Admitting: Cardiology

## 2018-03-27 VITALS — BP 96/66 | HR 52 | Ht 63.0 in | Wt 132.0 lb

## 2018-03-27 DIAGNOSIS — R002 Palpitations: Secondary | ICD-10-CM

## 2018-03-27 DIAGNOSIS — I472 Ventricular tachycardia: Secondary | ICD-10-CM

## 2018-03-27 DIAGNOSIS — I471 Supraventricular tachycardia: Secondary | ICD-10-CM

## 2018-03-27 DIAGNOSIS — Z4509 Encounter for adjustment and management of other cardiac device: Secondary | ICD-10-CM

## 2018-03-27 DIAGNOSIS — I4729 Other ventricular tachycardia: Secondary | ICD-10-CM

## 2018-03-27 NOTE — Patient Instructions (Addendum)
Routine follow up for several recent ED admissions for fast heart rate.    EKG today shows normal slow HR of 46 beats per minute  Loop monitor recordings show normal rhythm, but inappropriately faster at times (150 bpm)  Some extra beats detected, upper and lower chambers. Will get activator to get more details. If lower chamber extra beats detected, start medication. Upper chamber extra beats are not concerning and would not require treatment.    Discussed possible causes for fast heart rate including anemia, anxiety, emotion, dehydration, and inappropriate sinus tachycardia. Thyroid previously checked, normal.  Ablation in 2018 for inappropriate tachycardia with heart rate up to 230 beats per minute. Discussed current tracings, not as fast as previously was. Recordings are more consistent with normal fast heart rate.     Plan:  Follow up in 6 months.  Will get activator to be mailed to you.  Aggressive hydration.  Continue current medications.   Call with any questions or concerns at [phone] or contact through MyChart.    Randa Spike, NP
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
## 2018-03-28 LAB — IN PERSON LOOP DEVICE CHECK
Battery Date Time of Measurements: 20200210000000
Date Time Interrogation Session: 20200210104335
Implantable Pulse Generator Implant Date: 20180907000000
Lead Channel Setting Sensing Sensitivity: 0.035 mV
Tachy: 7

## 2018-03-29 ENCOUNTER — Encounter: Payer: Self-pay | Admitting: Cardiology

## 2018-04-05 LAB — EKG 12-LEAD
P: 69 deg
PR: 156 ms
QRS: 71 deg
QRSD: 88 ms
QT: 448 ms
QTc: 392 ms
Rate: 46 {beats}/min
T: 67 deg

## 2018-04-06 LAB — LOOP REMOTE CHARGEABLE DEVICE CHECK
Date Time Interrogation Session: 20200131001051
Implantable Pulse Generator Implant Date: 20180907000000

## 2018-04-06 LAB — UNSCHEDULED TRANSMISSION REMOTE DEVICE CHECK
Brady: 0
Date Time Interrogation Session: 20200123060500
Implantable Pulse Generator Implant Date: 20180907000000
Pause: 0
Recent AFIB Episodes: 0
Recent ATACH Episodes: 0
Symptom: 0
Tachy: 2

## 2018-04-15 ENCOUNTER — Ambulatory Visit
Admission: RE | Admit: 2018-04-15 | Discharge: 2018-04-15 | Disposition: A | Discharge status: Home / Self Care | Payer: Medicaid (Managed Care) | Source: Ambulatory Visit | Attending: Cardiology | Admitting: Cardiology

## 2018-04-15 DIAGNOSIS — R55 Syncope and collapse: Secondary | ICD-10-CM

## 2018-04-18 ENCOUNTER — Other Ambulatory Visit: Payer: Self-pay | Admitting: Cardiology

## 2018-04-18 DIAGNOSIS — R55 Syncope and collapse: Secondary | ICD-10-CM

## 2018-05-05 ENCOUNTER — Other Ambulatory Visit: Payer: Self-pay | Admitting: Student in an Organized Health Care Education/Training Program

## 2018-05-05 DIAGNOSIS — I471 Supraventricular tachycardia: Secondary | ICD-10-CM

## 2018-05-08 MED ORDER — METOPROLOL TARTRATE 37.5 MG PO TABS
ORAL_TABLET | ORAL | 2 refills | Status: DC
Start: 2018-05-08 — End: 2021-08-21

## 2018-05-15 ENCOUNTER — Ambulatory Visit
Admission: RE | Admit: 2018-05-15 | Discharge: 2018-05-15 | Disposition: A | Discharge status: Home / Self Care | Payer: Medicaid (Managed Care) | Source: Ambulatory Visit | Attending: Cardiology | Admitting: Cardiology

## 2018-05-15 DIAGNOSIS — R55 Syncope and collapse: Secondary | ICD-10-CM

## 2018-05-15 LAB — LOOP REMOTE CHARGEABLE DEVICE CHECK
Date Time Interrogation Session: 20200301001036
Implantable Pulse Generator Implant Date: 20180907000000

## 2018-05-16 ENCOUNTER — Other Ambulatory Visit: Payer: Self-pay | Admitting: Cardiology

## 2018-05-16 DIAGNOSIS — R55 Syncope and collapse: Secondary | ICD-10-CM

## 2018-05-17 LAB — LOOP REMOTE CHARGEABLE DEVICE CHECK
Date Time Interrogation Session: 20200331004041
Implantable Pulse Generator Implant Date: 20180907000000

## 2018-05-18 ENCOUNTER — Other Ambulatory Visit: Payer: Self-pay | Admitting: Cardiology

## 2018-05-18 ENCOUNTER — Ambulatory Visit
Admit: 2018-05-18 | Discharge: 2018-05-18 | Disposition: A | Discharge status: Home / Self Care | Payer: Medicaid (Managed Care) | Attending: Cardiology | Admitting: Cardiology

## 2018-05-18 DIAGNOSIS — R55 Syncope and collapse: Secondary | ICD-10-CM

## 2018-05-19 ENCOUNTER — Ambulatory Visit: Payer: Self-pay | Admitting: Student in an Organized Health Care Education/Training Program

## 2018-05-25 ENCOUNTER — Ambulatory Visit
Admit: 2018-05-25 | Discharge: 2018-05-25 | Disposition: A | Discharge status: Home / Self Care | Payer: Medicaid (Managed Care) | Attending: Cardiology | Admitting: Cardiology

## 2018-05-25 ENCOUNTER — Other Ambulatory Visit: Payer: Self-pay | Admitting: Cardiology

## 2018-05-25 ENCOUNTER — Telehealth: Payer: Self-pay | Admitting: Geriatric Medicine

## 2018-05-25 DIAGNOSIS — R55 Syncope and collapse: Secondary | ICD-10-CM

## 2018-05-25 LAB — UNSCHEDULED TRANSMISSION REMOTE DEVICE CHECK
Brady: 0
Brady: 0
Date Time Interrogation Session: 20200402050500
Date Time Interrogation Session: 20200409114235
Implantable Pulse Generator Implant Date: 20180907000000
Implantable Pulse Generator Implant Date: 20180907000000
Pause: 0
Pause: 0
Recent AFIB Episodes: 0
Recent AFIB Episodes: 0
Recent ATACH Episodes: 0
Recent ATACH Episodes: 0
Symptom: 0
Symptom: 1
Tachy: 1
Tachy: 1
VT1 Detection Rate: 150

## 2018-05-25 NOTE — Telephone Encounter (Signed)
-----   Message from Zetta Bills, MD sent at 05/25/2018  4:03 PM EDT -----  Regarding: RE: ILR Findings  Sounds like we were able to catch the exact moment of recording during her symptoms. If so, I definitely agree that it is unlikely due to one PVC. It might have triggered some vagal like symptoms but with the described severity, it is unlikely.    Thanks  Onalee Hua  ----- Message -----  From: Lebron Conners, PA  Sent: 05/25/2018   3:52 PM EDT  To: Zetta Bills, MD  Subject: ILR Findings                                     Dr. Renaldo Reel,    This patient sent in a remote after yesterday, she was standing there talking, when she had a sudden onset of "blurriness" and "everything turned yellow." She then began to sweat and her hands became sweaty and tingling. She also had trouble catching her breath.    Transmission showed sinus arrhythmia with rates between 60s and low 100s. Isolated PVC. No sustained tachyarrhythmias.    I explained to her that it is unlikely that this single, isolated PVC caused her symptoms, but she is convinced. Would you recommend any changes based on this? Thank you.    Viviann Spare

## 2018-05-25 NOTE — Telephone Encounter (Signed)
Contacted patient to discuss symptoms and recommendations.    Dr. Renaldo Reel was able to review the transmission and agrees that it is unlikely that the isolated PVC caused her symptoms, but it is possible that it trigger a vagal response. Based on the findings of this transmission, he would not make any medication changes.    I explained this to the patient who notes that this has happened a few times of the last couple months. I explained that from an arrhythmia perspective, we have not found an etiology for these symptoms, and that if it continues to happen, we could pursue finding a no-cardiac etiology of her symptoms. At this time, I recommended that she increase her hydration and send in another transmission if it happens again. She expressed understanding and was amenable to this plan.

## 2018-05-25 NOTE — Telephone Encounter (Signed)
Contacted patient to discuss symptoms and ILR findings.    She states that yesterday, she was standing there talking, when she had a sudden onset of "blurriness" and "everything turned yellow." She then began to sweat and her hands became sweaty and tingling. She also had trouble catching her breath.    She sent in a remote transmission that showed sinus arrhythmia with rates between 60s and low 100s. Isolated PVC. No sustained tachyarrhythmias.    I explained these findings to the patient and she believes that the isolated PVC is what she was experiencing. I explained to her that it is unlikely that a single, isolated PVC would cause these symptoms, but I will discuss her case further and contact her if any changes are indicated. She expressed understanding and was amenable to this plan.

## 2018-06-14 ENCOUNTER — Ambulatory Visit
Admission: RE | Admit: 2018-06-14 | Discharge: 2018-06-14 | Disposition: A | Discharge status: Home / Self Care | Payer: Medicaid (Managed Care) | Source: Ambulatory Visit

## 2018-06-14 DIAGNOSIS — R55 Syncope and collapse: Secondary | ICD-10-CM

## 2018-06-20 ENCOUNTER — Other Ambulatory Visit: Payer: Self-pay | Admitting: Cardiology

## 2018-06-20 DIAGNOSIS — R55 Syncope and collapse: Secondary | ICD-10-CM

## 2018-06-22 LAB — LOOP REMOTE CHARGEABLE DEVICE CHECK
Date Time Interrogation Session: 20200430004059
Implantable Pulse Generator Implant Date: 20180907000000

## 2018-07-14 ENCOUNTER — Ambulatory Visit
Admission: RE | Admit: 2018-07-14 | Discharge: 2018-07-14 | Disposition: A | Discharge status: Home / Self Care | Payer: Medicaid (Managed Care) | Source: Ambulatory Visit

## 2018-07-14 DIAGNOSIS — R55 Syncope and collapse: Secondary | ICD-10-CM

## 2018-07-19 ENCOUNTER — Other Ambulatory Visit: Payer: Self-pay | Admitting: Cardiology

## 2018-07-19 DIAGNOSIS — R55 Syncope and collapse: Secondary | ICD-10-CM

## 2018-07-21 ENCOUNTER — Ambulatory Visit: Payer: Self-pay | Admitting: Student in an Organized Health Care Education/Training Program

## 2018-07-25 LAB — LOOP REMOTE CHARGEABLE DEVICE CHECK
Date Time Interrogation Session: 20200530031222
Implantable Pulse Generator Implant Date: 20180907000000

## 2018-08-13 ENCOUNTER — Ambulatory Visit
Admission: RE | Admit: 2018-08-13 | Discharge: 2018-08-13 | Disposition: A | Discharge status: Home / Self Care | Payer: Medicaid (Managed Care) | Source: Ambulatory Visit

## 2018-08-13 DIAGNOSIS — R55 Syncope and collapse: Secondary | ICD-10-CM

## 2018-08-16 ENCOUNTER — Other Ambulatory Visit: Payer: Self-pay | Admitting: Cardiology

## 2018-08-16 DIAGNOSIS — R55 Syncope and collapse: Secondary | ICD-10-CM

## 2018-08-17 LAB — LOOP REMOTE CHARGEABLE DEVICE CHECK
Date Time Interrogation Session: 20200629033841
Implantable Pulse Generator Implant Date: 20180907000000

## 2018-09-05 ENCOUNTER — Other Ambulatory Visit: Payer: Self-pay

## 2018-09-05 DIAGNOSIS — I4891 Unspecified atrial fibrillation: Secondary | ICD-10-CM

## 2018-09-12 ENCOUNTER — Ambulatory Visit
Admission: RE | Admit: 2018-09-12 | Discharge: 2018-09-12 | Disposition: A | Discharge status: Home / Self Care | Payer: Medicaid (Managed Care) | Source: Ambulatory Visit

## 2018-09-12 DIAGNOSIS — R55 Syncope and collapse: Secondary | ICD-10-CM

## 2018-09-13 ENCOUNTER — Other Ambulatory Visit: Payer: Self-pay | Admitting: Cardiology

## 2018-09-13 DIAGNOSIS — R55 Syncope and collapse: Secondary | ICD-10-CM

## 2018-09-14 LAB — LOOP REMOTE CHARGEABLE DEVICE CHECK
Date Time Interrogation Session: 20200729033909
Implantable Pulse Generator Implant Date: 20180907000000

## 2018-09-18 ENCOUNTER — Ambulatory Visit: Payer: Self-pay

## 2018-10-02 ENCOUNTER — Ambulatory Visit: Payer: Self-pay

## 2018-10-12 ENCOUNTER — Ambulatory Visit
Admission: RE | Admit: 2018-10-12 | Discharge: 2018-10-12 | Disposition: A | Discharge status: Home / Self Care | Payer: Medicaid (Managed Care) | Source: Ambulatory Visit

## 2018-10-12 DIAGNOSIS — R55 Syncope and collapse: Secondary | ICD-10-CM

## 2018-10-16 ENCOUNTER — Other Ambulatory Visit: Payer: Self-pay | Admitting: Cardiology

## 2018-10-16 DIAGNOSIS — R55 Syncope and collapse: Secondary | ICD-10-CM

## 2018-10-19 LAB — LOOP REMOTE CHARGEABLE DEVICE CHECK
Date Time Interrogation Session: 20200828034106
Implantable Pulse Generator Implant Date: 20180907000000

## 2018-11-11 ENCOUNTER — Ambulatory Visit
Admission: RE | Admit: 2018-11-11 | Discharge: 2018-11-11 | Disposition: A | Discharge status: Home / Self Care | Payer: Medicaid (Managed Care) | Source: Ambulatory Visit

## 2018-11-11 DIAGNOSIS — R55 Syncope and collapse: Secondary | ICD-10-CM

## 2018-11-15 ENCOUNTER — Other Ambulatory Visit: Payer: Self-pay | Admitting: Cardiology

## 2018-11-15 DIAGNOSIS — R55 Syncope and collapse: Secondary | ICD-10-CM

## 2018-11-19 LAB — LOOP REMOTE CHARGEABLE DEVICE CHECK
Date Time Interrogation Session: 20200929105453
Implantable Pulse Generator Implant Date: 20180907000000

## 2018-12-14 ENCOUNTER — Ambulatory Visit
Admission: RE | Admit: 2018-12-14 | Discharge: 2018-12-14 | Disposition: A | Discharge status: Home / Self Care | Payer: Medicaid (Managed Care) | Source: Ambulatory Visit

## 2018-12-14 DIAGNOSIS — R55 Syncope and collapse: Secondary | ICD-10-CM

## 2018-12-18 ENCOUNTER — Other Ambulatory Visit: Payer: Self-pay | Admitting: Cardiology

## 2018-12-18 DIAGNOSIS — R55 Syncope and collapse: Secondary | ICD-10-CM

## 2018-12-19 LAB — LOOP REMOTE CHARGEABLE DEVICE CHECK
Date Time Interrogation Session: 20201029114643
Implantable Pulse Generator Implant Date: 20180907000000

## 2019-01-13 ENCOUNTER — Ambulatory Visit
Admission: RE | Admit: 2019-01-13 | Discharge: 2019-01-13 | Disposition: A | Discharge status: Home / Self Care | Payer: Medicaid (Managed Care) | Source: Ambulatory Visit

## 2019-01-13 DIAGNOSIS — R55 Syncope and collapse: Secondary | ICD-10-CM

## 2019-01-22 ENCOUNTER — Other Ambulatory Visit: Payer: Self-pay | Admitting: Cardiology

## 2019-01-22 DIAGNOSIS — R55 Syncope and collapse: Secondary | ICD-10-CM

## 2019-02-12 ENCOUNTER — Ambulatory Visit
Admission: RE | Admit: 2019-02-12 | Discharge: 2019-02-12 | Disposition: A | Discharge status: Home / Self Care | Payer: Medicaid (Managed Care) | Source: Ambulatory Visit

## 2019-02-12 DIAGNOSIS — R55 Syncope and collapse: Secondary | ICD-10-CM

## 2019-02-12 NOTE — Progress Notes (Signed)
Chief complaint: Neck, bilateral hands    History of present illness: This is a 38 year old female patient previously diagnosed with this disc hernia at C5-C6 was going to be sent for pain management but from what ever reason was not.  Overall she has been doing fairly well until recently she has noticed numbness and tingling in the both hands.  She has been going to see a Land.    Past medical history medications allerg ies surgical history social history review of systems are documented and placed in care connect    Physical exam: Female patient no apparent stress examination neck reveals good range of motion both in flexion extension as well as rotation to the right and to the left.  Mildly positive Hoffmann's bilaterally negative Tinel's negative Phalen's negative Romberg's negative Spurling's.    X-rays taken 5 views cer vical spine AP lateral bilateral bleak and no odontoid reveals normal lordotic curve no evidence of spondylolisthesis or fracture.    Impression cervical disc hernia with bilateral hand numbness and tingling    Recommendations and treatment: Patient be placed in bilateral cock-up wrist splints she will be sent for studies and will follow up with [place] after the results are available I will place her in Bilateral cock-up wrist conce rn the office today these will fit for and dispensed.  We will see her back with the results of nerve conduction

## 2019-02-14 LAB — LOOP REMOTE CHARGEABLE DEVICE CHECK
Date Time Interrogation Session: 20201128182943
Implantable Pulse Generator Implant Date: 20180907000000

## 2019-02-15 ENCOUNTER — Other Ambulatory Visit: Payer: Self-pay | Admitting: Cardiology

## 2019-02-15 DIAGNOSIS — R55 Syncope and collapse: Secondary | ICD-10-CM

## 2019-03-08 LAB — LOOP REMOTE CHARGEABLE DEVICE CHECK
Date Time Interrogation Session: 20201228185015
Implantable Pulse Generator Implant Date: 20180907000000

## 2019-03-14 ENCOUNTER — Ambulatory Visit
Admission: RE | Admit: 2019-03-14 | Discharge: 2019-03-14 | Disposition: A | Discharge status: Home / Self Care | Payer: Medicaid (Managed Care) | Source: Ambulatory Visit

## 2019-03-14 ENCOUNTER — Other Ambulatory Visit: Payer: Self-pay | Admitting: Cardiology

## 2019-03-14 DIAGNOSIS — R55 Syncope and collapse: Secondary | ICD-10-CM

## 2019-03-29 LAB — LOOP REMOTE CHARGEABLE DEVICE CHECK
Date Time Interrogation Session: 20210127193218
Implantable Pulse Generator Implant Date: 20180907000000

## 2019-04-13 ENCOUNTER — Ambulatory Visit
Admission: RE | Admit: 2019-04-13 | Discharge: 2019-04-13 | Disposition: A | Discharge status: Home / Self Care | Payer: Medicaid (Managed Care) | Source: Ambulatory Visit

## 2019-04-13 DIAGNOSIS — R55 Syncope and collapse: Secondary | ICD-10-CM

## 2019-04-17 ENCOUNTER — Other Ambulatory Visit: Payer: Self-pay | Admitting: Cardiology

## 2019-04-17 DIAGNOSIS — R55 Syncope and collapse: Secondary | ICD-10-CM

## 2019-04-26 LAB — LOOP REMOTE CHARGEABLE DEVICE CHECK
Date Time Interrogation Session: 20210226205100
Implantable Pulse Generator Implant Date: 20180907000000

## 2019-05-08 ENCOUNTER — Other Ambulatory Visit: Payer: Self-pay | Admitting: Pulmonary Disease

## 2019-05-08 DIAGNOSIS — Z23 Encounter for immunization: Secondary | ICD-10-CM

## 2019-05-13 ENCOUNTER — Ambulatory Visit
Admission: RE | Admit: 2019-05-13 | Discharge: 2019-05-13 | Disposition: A | Discharge status: Home / Self Care | Payer: Medicaid (Managed Care) | Source: Ambulatory Visit

## 2019-05-13 DIAGNOSIS — R55 Syncope and collapse: Secondary | ICD-10-CM

## 2019-05-15 ENCOUNTER — Other Ambulatory Visit: Payer: Self-pay | Admitting: Cardiology

## 2019-05-15 DIAGNOSIS — R55 Syncope and collapse: Secondary | ICD-10-CM

## 2019-05-17 LAB — LOOP REMOTE CHARGEABLE DEVICE CHECK
Date Time Interrogation Session: 20210328201535
Implantable Pulse Generator Implant Date: 20180907000000

## 2019-06-12 ENCOUNTER — Ambulatory Visit
Admission: RE | Admit: 2019-06-12 | Discharge: 2019-06-12 | Disposition: A | Discharge status: Home / Self Care | Payer: Medicaid (Managed Care) | Source: Ambulatory Visit

## 2019-06-12 DIAGNOSIS — R55 Syncope and collapse: Secondary | ICD-10-CM

## 2019-06-13 ENCOUNTER — Other Ambulatory Visit: Payer: Self-pay | Admitting: Cardiology

## 2019-06-13 DIAGNOSIS — R55 Syncope and collapse: Secondary | ICD-10-CM

## 2019-07-02 LAB — LOOP REMOTE CHARGEABLE DEVICE CHECK
Date Time Interrogation Session: 20210427202101
Implantable Pulse Generator Implant Date: 20180907000000

## 2019-07-12 ENCOUNTER — Ambulatory Visit
Admission: RE | Admit: 2019-07-12 | Discharge: 2019-07-12 | Disposition: A | Discharge status: Home / Self Care | Payer: Medicaid (Managed Care) | Source: Ambulatory Visit

## 2019-07-12 DIAGNOSIS — Z4509 Encounter for adjustment and management of other cardiac device: Secondary | ICD-10-CM

## 2019-07-12 DIAGNOSIS — R55 Syncope and collapse: Secondary | ICD-10-CM

## 2019-07-17 ENCOUNTER — Other Ambulatory Visit: Payer: Self-pay | Admitting: Cardiology

## 2019-07-17 DIAGNOSIS — R55 Syncope and collapse: Secondary | ICD-10-CM

## 2019-07-17 LAB — LOOP REMOTE CHARGEABLE DEVICE CHECK
Date Time Interrogation Session: 20210527210304
Implantable Pulse Generator Implant Date: 20180907000000

## 2019-08-11 ENCOUNTER — Ambulatory Visit
Admission: RE | Admit: 2019-08-11 | Discharge: 2019-08-11 | Disposition: A | Discharge status: Home / Self Care | Payer: Medicaid (Managed Care) | Source: Ambulatory Visit

## 2019-08-11 DIAGNOSIS — R55 Syncope and collapse: Secondary | ICD-10-CM

## 2019-08-15 ENCOUNTER — Other Ambulatory Visit: Payer: Self-pay | Admitting: Cardiology

## 2019-08-15 DIAGNOSIS — R55 Syncope and collapse: Secondary | ICD-10-CM

## 2019-08-23 LAB — LOOP REMOTE CHARGEABLE DEVICE CHECK
Date Time Interrogation Session: 20210626211145
Implantable Pulse Generator Implant Date: 20180907000000

## 2019-09-17 ENCOUNTER — Other Ambulatory Visit: Payer: Self-pay | Admitting: Cardiology

## 2019-09-17 DIAGNOSIS — R55 Syncope and collapse: Secondary | ICD-10-CM

## 2019-10-16 ENCOUNTER — Other Ambulatory Visit: Payer: Self-pay | Admitting: Cardiology

## 2019-10-16 DIAGNOSIS — R55 Syncope and collapse: Secondary | ICD-10-CM

## 2019-12-07 ENCOUNTER — Other Ambulatory Visit: Payer: Self-pay | Admitting: Cardiology

## 2019-12-07 DIAGNOSIS — R55 Syncope and collapse: Secondary | ICD-10-CM

## 2019-12-13 ENCOUNTER — Ambulatory Visit
Admission: RE | Admit: 2019-12-13 | Discharge: 2019-12-13 | Disposition: A | Discharge status: Home / Self Care | Payer: Medicaid Other | Source: Ambulatory Visit

## 2019-12-13 DIAGNOSIS — R55 Syncope and collapse: Secondary | ICD-10-CM

## 2019-12-19 ENCOUNTER — Other Ambulatory Visit: Payer: Self-pay | Admitting: Cardiology

## 2019-12-19 DIAGNOSIS — R55 Syncope and collapse: Secondary | ICD-10-CM

## 2019-12-30 LAB — LOOP REMOTE CHARGEABLE DEVICE CHECK
Date Time Interrogation Session: 20211028031113
Implantable Pulse Generator Implant Date: 20180907000000

## 2020-01-11 ENCOUNTER — Ambulatory Visit
Admission: RE | Admit: 2020-01-11 | Discharge: 2020-01-11 | Disposition: A | Discharge status: Home / Self Care | Payer: Medicaid Other | Source: Ambulatory Visit

## 2020-01-11 DIAGNOSIS — R55 Syncope and collapse: Secondary | ICD-10-CM

## 2020-01-15 ENCOUNTER — Other Ambulatory Visit: Payer: Self-pay | Admitting: Cardiology

## 2020-01-15 DIAGNOSIS — R55 Syncope and collapse: Secondary | ICD-10-CM

## 2020-01-21 LAB — LOOP REMOTE CHARGEABLE DEVICE CHECK
Date Time Interrogation Session: 20211127031124
Implantable Pulse Generator Implant Date: 20180907000000

## 2020-02-10 ENCOUNTER — Ambulatory Visit
Admission: RE | Admit: 2020-02-10 | Discharge: 2020-02-10 | Disposition: A | Discharge status: Home / Self Care | Payer: Medicaid Other | Source: Ambulatory Visit

## 2020-02-10 DIAGNOSIS — R55 Syncope and collapse: Secondary | ICD-10-CM

## 2020-02-13 ENCOUNTER — Other Ambulatory Visit: Payer: Self-pay | Admitting: Cardiology

## 2020-02-13 DIAGNOSIS — R55 Syncope and collapse: Secondary | ICD-10-CM

## 2020-03-04 LAB — LOOP REMOTE CHARGEABLE DEVICE CHECK
Date Time Interrogation Session: 20211227031602
Implantable Pulse Generator Implant Date: 20180907000000

## 2020-03-19 ENCOUNTER — Other Ambulatory Visit: Payer: Self-pay | Admitting: Cardiology

## 2020-03-19 DIAGNOSIS — R55 Syncope and collapse: Secondary | ICD-10-CM

## 2020-04-10 ENCOUNTER — Ambulatory Visit
Admission: RE | Admit: 2020-04-10 | Discharge: 2020-04-10 | Disposition: A | Discharge status: Home / Self Care | Payer: Medicaid Other | Source: Ambulatory Visit

## 2020-04-10 DIAGNOSIS — R55 Syncope and collapse: Secondary | ICD-10-CM

## 2020-04-14 ENCOUNTER — Other Ambulatory Visit: Payer: Self-pay | Admitting: Cardiology

## 2020-04-14 DIAGNOSIS — R55 Syncope and collapse: Secondary | ICD-10-CM

## 2020-05-11 ENCOUNTER — Ambulatory Visit
Admission: RE | Admit: 2020-05-11 | Discharge: 2020-05-11 | Disposition: A | Discharge status: Home / Self Care | Payer: Medicaid Other | Source: Ambulatory Visit

## 2020-05-11 DIAGNOSIS — R55 Syncope and collapse: Secondary | ICD-10-CM

## 2020-05-12 ENCOUNTER — Other Ambulatory Visit: Payer: Self-pay | Admitting: Cardiology

## 2020-05-12 ENCOUNTER — Ambulatory Visit: Admit: 2020-05-12 | Discharge: 2020-05-12 | Disposition: A | Discharge status: Home / Self Care | Payer: Medicaid Other

## 2020-05-12 DIAGNOSIS — R55 Syncope and collapse: Secondary | ICD-10-CM

## 2020-05-13 ENCOUNTER — Telehealth: Payer: Self-pay | Admitting: Cardiology

## 2020-05-13 ENCOUNTER — Encounter: Payer: Self-pay | Admitting: Cardiology

## 2020-05-13 LAB — UNSCHEDULED TRANSMISSION REMOTE DEVICE CHECK
Battery Date Time of Measurements: 20220319061504
Brady: 0
Date Time Interrogation Session: 20220327040500
Implantable Pulse Generator Implant Date: 20180907000000
Pause: 0
Recent AFIB Episodes: 0
Recent ATACH Episodes: 0
Symptom: 0
Tachy: 0
Time in AT/AF: 0

## 2020-05-13 NOTE — Telephone Encounter (Signed)
Left message on personal voicemail that the ILR has reached RRT.  Requested a phone call back to discuss explant versus leaving it alone.

## 2020-05-14 ENCOUNTER — Telehealth: Payer: Self-pay | Admitting: Cardiology

## 2020-05-14 DIAGNOSIS — R55 Syncope and collapse: Secondary | ICD-10-CM

## 2020-05-14 DIAGNOSIS — Z01812 Encounter for preprocedural laboratory examination: Secondary | ICD-10-CM

## 2020-05-14 NOTE — Telephone Encounter (Signed)
Cathlyn Parsons, NP  P Amb Card Ep Rn  Hi, please do blue sheet/instructions and contact patient with date for ILR explant            Previous Messages      ----- Message -----   From: Zetta Bills, MD   Sent: 05/14/2020  1:44 PM EDT   To: Cathlyn Parsons, NP   Subject: RE: ILR explant/reimplant               How about 4/21?     Thanks   ----- Message -----   From: Cathlyn Parsons, NP   Sent: 05/14/2020  1:06 PM EDT   To: Zetta Bills, MD   Subject: RE: ILR explant/reimplant               OK-need date for explant sometime end of April. She is fine with any Tuesday or Thursday.   ----- Message -----   From: Zetta Bills, MD   Sent: 05/14/2020 10:11 AM EDT   To: Cathlyn Parsons, NP   Subject: RE: ILR explant/reimplant               Did she have any syncope during monitor? Just wondering about the utility if very rare episodes. Extremely rare events will be much likely to be heart rhythm related also. We an always reimplant if needed.   Maybe see what she may think after explaining this?   tx   ----- Message -----   From: Cathlyn Parsons, NP   Sent: 05/14/2020  9:04 AM EDT   To: Zetta Bills, MD   Subject: ILR explant/reimplant                 ILR at RRT. No dysrhythmia correlation with syncope. Explant recommended. She prefers reimplant.

## 2020-05-14 NOTE — Telephone Encounter (Signed)
I reviewed ILR at RRT and recommended explant after reviewing no dysrhythmia correlation to her symptoms. She reports severe anxiety related to her past history of syncope and remains concerned despite no correlation. Her preference is for reimplant of ILR.

## 2020-05-29 LAB — LOOP REMOTE CHARGEABLE DEVICE CHECK
Date Time Interrogation Session: 20220225032501
Implantable Pulse Generator Implant Date: 20180907000000

## 2021-06-22 ENCOUNTER — Telehealth: Payer: Self-pay

## 2021-06-22 NOTE — Progress Notes (Signed)
Zetta Bills, MD  P Amb Card Ep Rn  Cc: P Amb Card Ep Support Staff; Hayes Ludwig, Londell Moh to schedule with Asher Muir please     Thanks          Previous Messages      ----- Message -----   From: Dorothe Pea   Sent: 06/19/2021 11:21 AM EDT   To: Zetta Bills, MD; Amb Card Ep Support Staff; *   Subject: Re: Loop recorder removal              Per patient call today, she would like to schedule a date for her loop recorder to be removed.     Her contact phone # [phone]     Thank you,   Fannie Knee

## 2021-06-22 NOTE — Telephone Encounter (Signed)
Spoke to patient who accepts Tues 5/23 for ILR explant.  Instructions as per letters tab.  Patient expresses understanding and is agreeable to this plan.  Encouraged to call with questions or concerns.  Advised I would send message to the schedulers to come in for annual visit at her request.                   JDeans RN

## 2021-06-23 ENCOUNTER — Encounter: Payer: Self-pay | Admitting: Cardiology

## 2021-06-23 ENCOUNTER — Encounter: Payer: Self-pay | Admitting: Gastroenterology

## 2021-07-07 ENCOUNTER — Encounter
Admission: RE | Disposition: A | Discharge status: Home / Self Care | Payer: Self-pay | Source: Ambulatory Visit | Attending: Cardiology

## 2021-07-07 ENCOUNTER — Ambulatory Visit
Admission: RE | Admit: 2021-07-07 | Discharge: 2021-07-07 | Disposition: A | Discharge status: Home / Self Care | Payer: Medicaid Other | Source: Ambulatory Visit | Attending: Cardiology | Admitting: Cardiology

## 2021-07-07 DIAGNOSIS — Z4509 Encounter for adjustment and management of other cardiac device: Secondary | ICD-10-CM

## 2021-07-07 DIAGNOSIS — R55 Syncope and collapse: Secondary | ICD-10-CM | POA: Insufficient documentation

## 2021-07-07 HISTORY — PX: PR REMOVAL SUBCUTANEOUS CARDIAC RHYTHM MONITOR: 33286

## 2021-07-07 SURGERY — INJECTABLE LOOP EXPLANT

## 2021-07-07 MED ORDER — LIDOCAINE-EPINEPHRINE 1 %-1:100000 IJ SOLN *I*
INTRAMUSCULAR | Status: AC
Start: 2021-07-07 — End: 2021-07-07
  Filled 2021-07-07: qty 20

## 2021-07-07 MED ORDER — LIDOCAINE-EPINEPHRINE 1 %-1:100000 IJ SOLN *I*
INTRAMUSCULAR | Status: DC | PRN
Start: 2021-07-07 — End: 2021-07-07
  Administered 2021-07-07: 20 mL via SUBCUTANEOUS

## 2021-07-07 SURGICAL SUPPLY — 5 items
ADHESIVE SKIN CLOSURE 0.7ML DERMABOND ADVANCED (Supply) ×2 IMPLANT
APPLICATOR CHLORAPREP STER  ORANGE 10.5ML (Supply) ×4 IMPLANT
PACK CUSTOM PACEMAKER PACK (Pack) ×2 IMPLANT
SCALPEL STRILE DISP #15 SAFETY NL (Supply) ×2 IMPLANT
SUTR MONCRYL 4-0 PS-2 UND 27IN (Suture) ×2 IMPLANT

## 2021-07-07 NOTE — Discharge Instructions (Addendum)
[facility]  Cardiac Catheterization  Electrophysiology Labs  Patient Discharge Instructions    Procedure Date: 07/07/2021    Attending Physician: Dr. Renaldo Reel                              Procedure    Loop Recorder Explant    Recommended diet   Low sodium, low cholesterol, low sugar diet if you have diabetes.    A healthy diet is important to help you stay well. Some health conditions require you to be on a special diet. For example, if you have heart failure you should monitor your fluid intake and limit the amount of sodium including table salt.  This will help you avoid fluid retention which can cause shortness of breath or swelling of the feet and ankles. Reading food labels is helpful when you are on a special diet. Follow instructions from your doctor for any other special dietary requirement.    Recommended activity   Walk and use stairs as tolerated.   Do not lift anything >10 lbs x 3 days.    No driving the same day of the procedure.      You may resume sexual activity as tolerated.    Incision Care  ??Surgical Adhesive:  Avoid rubbing or friction over the adhesive. You may gently and briefly shower or bathe the area the next day. DO NOT rub; pat dry very gently. Do not swim or soak in a tub for 7 days after the surgery. Do not apply lotions on or around the adhesive for at least 7 days. Do not scratch, rub or pick at the adhesive; it will wear off in approximately 7 days after the surgery. Avoid tanning while it is in place. You may apply a dry gauze bandage if necessary to protect the wound; be careful to keep tape off the adhesive.    What to do if my condition changes?  Within 24 hours of hospital discharge  Call the electrophysiology fellow on call at [phone] for any sign of infection such as redness, warmth, swelling, tenderness or drainage around the wound, or if you develop a fever > 101 degrees, experience dizziness, fainting, extreme weakness, chest pain or shortness of breath.      24 hours after hospital discharge  If you experience chest pain, shortness of breath, or other problems after 24 hours of hospital discharge call Monico Hoar, MD at [phone].    If you have a procedure-related issue after 24 hours, call Dr. Renaldo Reel at [phone].    Smoking  Smoking can increase your chances of developing chronic health problems or worsen conditions you already have. If you smoke you should quit. Smoking cessation information has been given to you for your review to help you quit.  Medications to help you quit are available. Ask your doctor if you would like to receive these medications.    Medications  You may take acetaminophen (Tylenol) as needed for pain.  Please refer to your medication reconciliation form and take your medications exactly as prescribed.      For more information:  Please visit the [facility] Cardiac Catheterization and Electrophysiology Lab web site for more information about your procedure, medical condition and for more medical references.      Provider Signature: Katherine Roan, NP                Instructions reviewed with patient by:___________________________________  Signature/Title   Date

## 2021-07-07 NOTE — Progress Notes (Signed)
S/p [employer] loop recorder explant   - Left chest wall with dermabond, no hematoma   - No need for follow up    - Discharge home  Katherine Roan, NP

## 2021-07-07 NOTE — Progress Notes (Signed)
Pt arrived to recover post loop explant. Vital signs stable. Denies pain. D/C instruction reviewed. No questions at this time. Plan to D/C home.

## 2021-07-07 NOTE — Preop H&P (Signed)
Pre-EP Procedure Note      Subjective:     41 year old female with a history of palpitations s/p ILR (2018) who's device has reached ERI and is referred for loop recorder explant.     Past Medical History:   Diagnosis Date   . Atrial fibrillation    . AVNRT (AV nodal re-entry tachycardia)    . Chalazion of both eyes    . Depression    . DVT (deep venous thrombosis) 2006    post knee surgery       Social History     Tobacco Use   Smoking Status Never   Smokeless Tobacco Never   Vaping Use   Vaping Status Not on file       Allergies:    Allergies   Allergen Reactions   . Erythromycin Other (See Comments)     Unknown-childhood allergy per pt     . Sulfa Antibiotics Other (See Comments)     Unknown childhood allergy       Prior to Admission Medications:  Medications Prior to Admission   Medication Sig   . metoprolol 37.5 MG TABS Take two tabs (37.   . escitalopram (LEXAPRO) 10 MG tablet Take 10 mg by mouth daily   . baclofen (LIORESAL) 10 MG tablet TAKE 1 2 TO 1 (ONE HALF TO ONE) TABLET BY MOUTH UP TO THREE TIMES DAILY AS NEEDED   . HYDROcodone-acetaminophen (NORCO) 5-325 MG per tablet Take 1 tablet by mouth every 6 hours as needed for Pain   . clonazePAM (KLONOPIN) 0.5 MG tablet Take 0.5 mg by mouth every 4 hours as needed for Anxiety   Take 1/2 -1 tab every 4 hours as needed.       Active Hospital Medications:  No current facility-administered medications for this encounter.       Objective:   Vitals:  Blood pressure 103/55, pulse 61, temperature 36.8 C (98.2 F), temperature source Temporal, resp. rate 16, height 1.6 m (5' 2.99"), weight 61.2 kg (135 lb), SpO2 100 %.    Vitals in last 24 hrs:  Patient Vitals for the past 24 hrs:   BP Temp Temp src Pulse Resp SpO2 Height Weight   07/07/21 1431 103/55 36.8 C (98.2 F) TEMPORAL 61 16 100 % 1.6 m (5' 2.99") 61.2 kg (135 lb)               Physical Exam  GEN - AO, NAD  PULM - BS bil, no w/r/s/c/acc muscle use  C/V - RR, S1, S2, No m/r/g  ABD - NABS, soft, nt/nd  EXT - No  LE edema  NM - Answering questions appropriately, sits up independently          Lab Review  Lab Results   Component Value Date    NA 141 10/22/2016    K 4.1 10/22/2016    CL 105 10/22/2016    CO2 23 10/22/2016    UN 11 10/22/2016    CREAT 0.73 03/04/2018    CA 9.4 03/04/2018    GLU 92 10/22/2016    WBC 6.6 03/04/2018    HCT 39 03/04/2018    HGB 13.0 03/04/2018    MCV 88 03/04/2018    PLT 262 03/04/2018    GFRB 114 10/22/2016    GFRC 99 10/22/2016       Anesthesiologist's Physical Status Rating of the Patient:  Class III: Severe Systemic Disease    Plan for Sedation:  No plan for sedation  Assessment:   Danielle Hall is a 41 y.o. female with palpitations s/p ILR.  Indications for procedure:  Battery depletion     Plan:   Proceed with Loop recorder explant          Author:  Katherine Roan, NP  on 07/07/2021 at 3:53 PM

## 2021-07-08 ENCOUNTER — Encounter: Payer: Self-pay | Admitting: Family

## 2021-07-29 ENCOUNTER — Encounter: Payer: Self-pay | Admitting: Gastroenterology

## 2021-08-15 HISTORY — PX: UPPER GASTROINTESTINAL ENDOSCOPY: SHX188

## 2021-08-20 ENCOUNTER — Telehealth: Payer: Self-pay

## 2021-08-20 NOTE — Telephone Encounter (Signed)
Spoke with Danielle Hall regarding referral and appointment

## 2021-08-21 DIAGNOSIS — I872 Venous insufficiency (chronic) (peripheral): Secondary | ICD-10-CM | POA: Insufficient documentation

## 2021-08-21 DIAGNOSIS — R109 Unspecified abdominal pain: Secondary | ICD-10-CM | POA: Insufficient documentation

## 2021-08-21 DIAGNOSIS — R87619 Unspecified abnormal cytological findings in specimens from cervix uteri: Secondary | ICD-10-CM

## 2021-08-26 ENCOUNTER — Telehealth: Payer: Self-pay | Admitting: Cardiology

## 2021-08-26 ENCOUNTER — Telehealth: Payer: Self-pay

## 2021-08-26 ENCOUNTER — Ambulatory Visit: Payer: Medicaid Other | Admitting: Gynecologic Oncology

## 2021-08-26 ENCOUNTER — Other Ambulatory Visit: Payer: Self-pay

## 2021-08-26 VITALS — BP 120/70 | HR 65 | Temp 97.3°F | Resp 18 | Ht 62.0 in | Wt 134.8 lb

## 2021-08-26 DIAGNOSIS — R87619 Unspecified abnormal cytological findings in specimens from cervix uteri: Secondary | ICD-10-CM

## 2021-08-26 DIAGNOSIS — Z01818 Encounter for other preprocedural examination: Secondary | ICD-10-CM

## 2021-08-26 NOTE — Telephone Encounter (Signed)
Clydie Braun (Dr Kipp Brood Dubeshter's office ) is calling to request a cardiac clearance for Danielle Hall to have a cervical biopsy on 7/27. A letter can be placed in the patient's record. [phone]

## 2021-08-26 NOTE — Telephone Encounter (Signed)
Cardiac clearance letter sent (see letters tab).     Danielle Bills, MD  [name], Rulon Abide, RN; P Amb Card Ep Rn  Caller: Unspecified (Today, 10:42 AM)  Rip [place] to proceed     May be prone for vasovagal. Advise precautions such as watching for warning symptoms and if needed, IV fluids     Thanks

## 2021-08-26 NOTE — Preop H&P (Unsigned)
Subjective:      Patient ID: Danielle Hall is a 40 y.o. female    HPI Danielle Hall was recently evaluated for an abnormal pap smear showing atypical glandular cells. Hx notable for cardiac arrhythmias.. Not anticoagulated. Does not desire future childbearing (tubal).  Chief Complaint   Patient presents with   . Atypical glandular cells on pap      Past Medical History:   Diagnosis Date   . Atrial fibrillation    . AVNRT (AV nodal re-entry tachycardia)    . Chalazion of both eyes    . Depression    . DVT (deep venous thrombosis) 2006    post knee surgery      Patient Active Problem List    Diagnosis Date Noted   . Edema of right lower extremity due to peripheral venous insufficiency 08/21/2021   . Abdominal pain 08/21/2021   . Chalazion of both eyes    . AVNRT (AV nodal re-entry tachycardia) 12/17/2016   . A-fib 08/31/2016   . DVT (deep venous thrombosis) 08/31/2016     Past Surgical History:   Procedure Laterality Date   . CARDIAC ELECTROPHYSIOLOGY PROCEDURE N/A 10/21/2016    Procedure: Ablation - SVT;  Surgeon: Huang, David, MD;  Location: [facility] EP LABS;  Service:    . HERNIA REPAIR     . KNEE SURGERY     . PR IMPLANTATION PT-ACTIVATED CARDIAC EVENT RECORDER N/A 10/22/2016    Procedure: Loop Recorder Implant;  Surgeon: Aktas, Mehmet, MD;  Location: [facility] EP LABS;  Service: Cardiovascular   . PR REMOVAL SUBCUTANEOUS CARDIAC RHYTHM MONITOR N/A 07/07/2021    Procedure: Injectable Loop Explant;  Surgeon: Maggio, Jamie L, NP;  Location: [facility] EP LABS;  Service: Cardiovascular   . TUBAL LIGATION       Current Outpatient Medications   Medication   . meclizine (ANTIVERT) 25 mg tablet   . doxycycline hyclate (VIBRA-TABS) 100 mg tablet   . topiramate (TOPAMAX) 25 mg tablet   . omeprazole (PRILOSEC) 20 mg capsule   . cyclobenzaprine (FLEXERIL) 10 mg tablet   . ibuprofen (ADVIL,MOTRIN) 800 mg tablet   . traZODone (DESYREL) 50 mg tablet   . metoprolol tartrate (LOPRESSOR) 25 mg tablet   . escitalopram (LEXAPRO) 10 MG tablet   . baclofen  (LIORESAL) 10 MG tablet   . HYDROcodone-acetaminophen (NORCO) 5-325 MG per tablet   . clonazePAM (KLONOPIN) 0.5 MG tablet     No current facility-administered medications for this visit.     Current Outpatient Medications on File Prior to Visit   Medication Sig Dispense Refill   . meclizine (ANTIVERT) 25 mg tablet Take 1 tablet (25 mg total) by mouth 3 times daily as needed (Patient not taking: Reported on 08/26/2021)     . doxycycline hyclate (VIBRA-TABS) 100 mg tablet Take 1 tablet (100 mg total) by mouth every 12 hours (Patient not taking: Reported on 08/26/2021)     . topiramate (TOPAMAX) 25 mg tablet Take 1 tablet (25 mg total) by mouth 2 times daily (Patient not taking: Reported on 08/26/2021)     . omeprazole (PRILOSEC) 20 mg capsule Take 1 capsule (20 mg total) by mouth daily (before breakfast)     . cyclobenzaprine (FLEXERIL) 10 mg tablet Take 1 tablet (10 mg total) by mouth 3 times daily as needed for Muscle spasms (Patient not taking: Reported on 08/26/2021)     . ibuprofen (ADVIL,MOTRIN) 800 mg tablet Take 1 tablet (800 mg total) by mouth 3 times daily as   needed for Pain     . traZODone (DESYREL) 50 mg tablet Take 1 tablet (50 mg total) by mouth nightly (Patient not taking: Reported on 08/26/2021)     . metoprolol tartrate (LOPRESSOR) 25 mg tablet SMARTSIG:1.5 Tablet(s) By Mouth Twice Daily     . escitalopram (LEXAPRO) 10 MG tablet Take 10 mg by mouth daily (Patient not taking: Reported on 08/26/2021)     . baclofen (LIORESAL) 10 MG tablet TAKE 1 2 TO 1 (ONE HALF TO ONE) TABLET BY MOUTH UP TO THREE TIMES DAILY AS NEEDED (Patient not taking: Reported on 08/26/2021)     . HYDROcodone-acetaminophen (NORCO) 5-325 MG per tablet Take 1 tablet by mouth every 6 hours as needed for Pain (Patient not taking: Reported on 08/26/2021)     . clonazePAM (KLONOPIN) 0.5 MG tablet Take 0.5 mg by mouth every 4 hours as needed for Anxiety   Take 1/2 -1 tab every 4 hours as needed.       No current facility-administered medications  on file prior to visit.     Allergies   Allergen Reactions   . Erythromycin Other (See Comments)     Unknown-childhood allergy per pt     . Sulfa Antibiotics Other (See Comments)     Unknown childhood allergy   }     Review of Systems    Review of Systems   Constitutional: Negative.    HENT: Negative.    Eyes: Negative.    Respiratory: Negative.    Cardiovascular: Followed by cardiology   Gastrointestinal: Negative.    Endocrine: Negative.    Genitourinary: Negative.    Neurological: Negative.    Hematological: Negative.    Psychiatric/Behavioral: Negative.        Objective:     Vitals:    08/26/21 0907   BP: 120/70   Pulse: 65   Resp: 18   Temp: 36.3 C (97.3 F)   Weight: 61.1 kg (134 lb 12.8 oz)   Height: 157.5 cm (5' 2")             Physical Exam     Physical Exam     BP 120/70 (BP Location: Right arm, Patient Position: Sitting, Cuff Size: adult)   Pulse 65   Temp 36.3 C (97.3 F) (Temporal)   Resp 18   Ht 157.5 cm (5' 2")   Wt 61.1 kg (134 lb 12.8 oz)   SpO2 97%   BMI 24.66 kg/m     Constitutional: She is oriented to person, place, and time. She appears well-developed and well-nourished.   Head: Normocephalic and atraumatic.   Eyes: Conjunctivae and EOM are normal.   Neck: Normal range of motion.  Cor: regular rate and rhythm without murmur   Pulmonary/Chest: Effort normal, unlabored. Clear to ascultation.  Abdominal: Soft. Not distended   Musculoskeletal: Normal range of motion. No edema noted  Neurological: She is alert and oriented to person, place, and time.   Skin: Skin is without rash.   Psychiatric: She has a normal mood and affect.     Pelvic: Cx visualized - normal appearance    Assessment:     Atypical glandular cells on pap    Patient Active Problem List   Diagnosis Code   . A-fib I48.91   . DVT (deep venous thrombosis) I82.409   . AVNRT (AV nodal re-entry tachycardia) I47.1   . Chalazion of both eyes H00.13, H00.16   . Edema of right lower extremity due to peripheral venous insufficiency    I87.2   . Abdominal pain R10.9         Plan:     Preop testing  Cardiology clearance  LEEP conization with D&C  I discussed the proposed procedure with the patient and answered all questions.

## 2021-08-26 NOTE — Telephone Encounter (Signed)
SW Clydie Braun at Dr. Phylliss Bob office requesting cardiac clearance for procedure on 7/27. Let her know we need a letter stated Zameria is stable cardiac wise for her Leep, about a 30 minute procedure and if they want any meds held to let us know.    They will drop a letter into her my chart.    She will send the message over to the nursing staff and they will call back with any questions.

## 2021-08-26 NOTE — H&P (View-Only) (Signed)
Subjective:      Patient ID: Danielle Hall is a 41 y.o. female    HPI Danielle Hall was recently evaluated for an abnormal pap smear showing atypical glandular cells. Hx notable for cardiac arrhythmias.. Not anticoagulated. Does not desire future childbearing (tubal).  Chief Complaint   Patient presents with   . Atypical glandular cells on pap      Past Medical History:   Diagnosis Date   . Atrial fibrillation    . AVNRT (AV nodal re-entry tachycardia)    . Chalazion of both eyes    . Depression    . DVT (deep venous thrombosis) 2006    post knee surgery      Patient Active Problem List    Diagnosis Date Noted   . Edema of right lower extremity due to peripheral venous insufficiency 08/21/2021   . Abdominal pain 08/21/2021   . Chalazion of both eyes    . AVNRT (AV nodal re-entry tachycardia) 12/17/2016   . A-fib 08/31/2016   . DVT (deep venous thrombosis) 08/31/2016     Past Surgical History:   Procedure Laterality Date   . CARDIAC ELECTROPHYSIOLOGY PROCEDURE N/A 10/21/2016    Procedure: Ablation - SVT;  Surgeon: Zetta Bills, MD;  Location: [facility] EP [facility]S;  Service:    . HERNIA REPAIR     . KNEE SURGERY     . PR IMPLANTATION PT-ACTIVATED CARDIAC EVENT RECORDER N/A 10/22/2016    Procedure: Loop Recorder Implant;  Surgeon: Astrid Divine, MD;  Location: [facility] EP LABS;  Service: Cardiovascular   . PR REMOVAL SUBCUTANEOUS CARDIAC RHYTHM MONITOR N/A 07/07/2021    Procedure: Injectable Loop Explant;  Surgeon: Katherine Roan, NP;  Location: [facility] EP LABS;  Service: Cardiovascular   . TUBAL LIGATION       Current Outpatient Medications   Medication   . meclizine (ANTIVERT) 25 mg tablet   . doxycycline hyclate (VIBRA-TABS) 100 mg tablet   . topiramate (TOPAMAX) 25 mg tablet   . omeprazole (PRILOSEC) 20 mg capsule   . cyclobenzaprine (FLEXERIL) 10 mg tablet   . ibuprofen (ADVIL,MOTRIN) 800 mg tablet   . traZODone (DESYREL) 50 mg tablet   . metoprolol tartrate (LOPRESSOR) 25 mg tablet   . escitalopram (LEXAPRO) 10 MG tablet   . baclofen  (LIORESAL) 10 MG tablet   . HYDROcodone-acetaminophen (NORCO) 5-325 MG per tablet   . clonazePAM (KLONOPIN) 0.5 MG tablet     No current facility-administered medications for this visit.     Current Outpatient Medications on File Prior to Visit   Medication Sig Dispense Refill   . meclizine (ANTIVERT) 25 mg tablet Take 1 tablet (25 mg total) by mouth 3 times daily as needed (Patient not taking: Reported on 08/26/2021)     . doxycycline hyclate (VIBRA-TABS) 100 mg tablet Take 1 tablet (100 mg total) by mouth every 12 hours (Patient not taking: Reported on 08/26/2021)     . topiramate (TOPAMAX) 25 mg tablet Take 1 tablet (25 mg total) by mouth 2 times daily (Patient not taking: Reported on 08/26/2021)     . omeprazole (PRILOSEC) 20 mg capsule Take 1 capsule (20 mg total) by mouth daily (before breakfast)     . cyclobenzaprine (FLEXERIL) 10 mg tablet Take 1 tablet (10 mg total) by mouth 3 times daily as needed for Muscle spasms (Patient not taking: Reported on 08/26/2021)     . ibuprofen (ADVIL,MOTRIN) 800 mg tablet Take 1 tablet (800 mg total) by mouth 3 times daily as  needed for Pain     . traZODone (DESYREL) 50 mg tablet Take 1 tablet (50 mg total) by mouth nightly (Patient not taking: Reported on 08/26/2021)     . metoprolol tartrate (LOPRESSOR) 25 mg tablet SMARTSIG:1.5 Tablet(s) By Mouth Twice Daily     . escitalopram (LEXAPRO) 10 MG tablet Take 10 mg by mouth daily (Patient not taking: Reported on 08/26/2021)     . baclofen (LIORESAL) 10 MG tablet TAKE 1 2 TO 1 (ONE HALF TO ONE) TABLET BY MOUTH UP TO THREE TIMES DAILY AS NEEDED (Patient not taking: Reported on 08/26/2021)     . HYDROcodone-acetaminophen (NORCO) 5-325 MG per tablet Take 1 tablet by mouth every 6 hours as needed for Pain (Patient not taking: Reported on 08/26/2021)     . clonazePAM (KLONOPIN) 0.5 MG tablet Take 0.5 mg by mouth every 4 hours as needed for Anxiety   Take 1/2 -1 tab every 4 hours as needed.       No current facility-administered medications  on file prior to visit.     Allergies   Allergen Reactions   . Erythromycin Other (See Comments)     Unknown-childhood allergy per pt     . Sulfa Antibiotics Other (See Comments)     Unknown childhood allergy   }     Review of Systems    Review of Systems   Constitutional: Negative.    HENT: Negative.    Eyes: Negative.    Respiratory: Negative.    Cardiovascular: Followed by cardiology   Gastrointestinal: Negative.    Endocrine: Negative.    Genitourinary: Negative.    Neurological: Negative.    Hematological: Negative.    Psychiatric/Behavioral: Negative.        Objective:     Vitals:    08/26/21 0907   BP: 120/70   Pulse: 65   Resp: 18   Temp: 36.3 C (97.3 F)   Weight: 61.1 kg (134 lb 12.8 oz)   Height: 157.5 cm (5\' 2" )             Physical Exam     Physical Exam     BP 120/70 (BP Location: Right arm, Patient Position: Sitting, Cuff Size: adult)   Pulse 65   Temp 36.3 C (97.3 F) (Temporal)   Resp 18   Ht 157.5 cm (5\' 2" )   Wt 61.1 kg (134 lb 12.8 oz)   SpO2 97%   BMI 24.66 kg/m     Constitutional: She is oriented to person, place, and time. She appears well-developed and well-nourished.   Head: Normocephalic and atraumatic.   Eyes: Conjunctivae and EOM are normal.   Neck: Normal range of motion.  Cor: regular rate and rhythm without murmur   Pulmonary/Chest: Effort normal, unlabored. Clear to ascultation.  Abdominal: Soft. Not distended   Musculoskeletal: Normal range of motion. No edema noted  Neurological: She is alert and oriented to person, place, and time.   Skin: Skin is without rash.   Psychiatric: She has a normal mood and affect.     Pelvic: Cx visualized - normal appearance    Assessment:     Atypical glandular cells on pap    Patient Active Problem List   Diagnosis Code   . A-fib I48.91   . DVT (deep venous thrombosis) I82.409   . AVNRT (AV nodal re-entry tachycardia) I47.1   . Chalazion of both eyes H00.13, H00.16   . Edema of right lower extremity due to peripheral venous insufficiency  I87.2   . Abdominal pain R10.9         Plan:     Preop testing  Cardiology clearance  LEEP conization with D&C  I discussed the proposed procedure with the patient and answered all questions.

## 2021-08-26 NOTE — Invasive Procedure Plan of Care (Signed)
CONSENT FOR MEDICAL  OR SURGICAL PROCEDURE                            Patient Name: Danielle Hall  [facility], [address]                                                              DOB: 1980/07/18        . Please read this form or have someone read it to you.  . It's important to understand all parts of this form. If something isn't clear, ask [place] to explain.  . When you sign it, that means you understand the form and give [place] permission to do this surgery or procedure.    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 By doing this surgery or procedure on me: Remove portion of cervix and bx uterine lining  . This is also known as: LEEP conization and D&C  . Laterality: Not applicable     *if you'd like a list of the assistants, please ask. We can give that to you.    1. The care provider has explained my condition to me. They have told me how the procedure can help me. They have told me about other ways of treating my condition. I understand the care provider cannot guarantee the result of the procedure. If I don't have this procedure, my other choices are: Not to do it    2. The care provider has told me the risks (problems that can happen) of the procedure. I understand there may be unwanted results. The risks that are related to this procedure include: Bleeding, infection, damage to surround organs, death      3. I understand that during the procedure, my care provider may find a condition that we didn't know about before the treatment started. Therefore, I agree that my care provider can perform any other treatment which they think is necessary and available.    4. I give permission to the hospital and/or its departments to examine and keep tissue, blood, body parts, fluids or materials removed from my body during the procedure(s) to aid in diagnosis and treatment, after which they may be used for scientific research  or teaching by appropriate persons. If these materials are used for science or teaching, my identity will be protected. I will no longer own or have any rights to these materials regardless of how they may be used.    5. My care provider might want a representative from a medical device company to be there during my procedure. I understand that person works for:          The ways they might help my care provider during my procedure include:            6. Here are my decisions about receiving blood, blood products, or tissues. I understand my decisions cover the time before, during and after my procedure, my treatment, and my time in the hospital. After my procedure, if my condition changes a lot, my care provider will talk with me again about receiving blood or blood products. At that time, my  care provider might need me to review and sign another consent form, about getting or refusing blood.    I understand that the blood is from the community blood supply. Volunteers donated the blood, the volunteers were screened for health problems. The blood was examined with very sensitive and accurate tests to look for hepatitis, HIV/AIDS, and other diseases. Before I receive blood, it is tested again to make sure it is the correct type.    My chances of getting a sickness from blood products are small. But no transfusion is 100% safe. I understand that my care provider feels the good I will receive from the blood is greater than the chances of something going wrong. My care provider has answered my questions about blood products.      My decision  about blood or  blood products   Yes, I agree to receive blood or blood products if my care provider thinks they're needed.        My decision   about tissue  Implants     Not applicable.          I understand this  form.    My care provider  or his/her  assistants have  explained:   What I am having done and why I need it.  What other choices I can make instead of having this  done.  The benefits and possible risks (problems) to me of having this done.  The benefits and possible risks (problems) to me of receiving transplants, blood, or blood products.  There is no guarantee of the results.  The care provider may not stay with me the entire time that I am in the operating or procedure room.  My provider has explained how this may affect my procedure. My provider has answered my  questions about this.         I give my  permission for  this surgery or  procedure.            _______________________________________________                                     My signature  (or parent or other person authorized to sign for you, if you are unable to sign for  yourself or if you are under 57 years old)        ______           Date        _____        Time   Electronic Signatures will display at the bottom of the consent form.    Care provider's statement: I have discussed the planned procedure, including the possibility for transfusion of blood  products or receipt of tissue as necessary; expected benefits; the possible complications and risks; and possible alternatives  and their benefits and risks with the patients or the patient's surrogate. In my opinion, the patient or the patient's surrogate  understands the proposed procedure, its risks, benefits and alternatives.              Electronically signed by: Rosina Lowenstein, MD                                                08/26/2021  Date        9:27 AM        Time   Your doctor or someone your doctor has appointed has told you that you may need blood or a blood product transfusion, which has been collected from volunteers, as part of your treatment as a patient.    The reasons you might need blood or blood products include, but are not limited to:    Significant loss of your own blood   Your body may not be getting enough oxygen to its tissues    Treatment of bleeding disorders caused by low platelet counts or platelets that do not  work right (platelets are part of a cell that helps to form clots and keeps you from bleeding too much).   You may not have enough of other substances that help your blood clot or stop you from bleeding more  The risks of getting a transfusion of blood or blood products include, but are not limited to:    Damage to the lungs   Difficulty breathing due to fluid in the lungs   The product may contain bacteria or rarely a virus (which includes HIV and Hepatitis)   Blood from the community blood supply has been collected from volunteer donors who have been screened for health risk. The blood has been tested for major blood transmitted disease, but no transfusion is 100% safe. The blood is tested with very sensitive and accurate tests to screen for hepatitis, AIDS, and other disease, which makes the risks very small.   You may have side effects from the transfusion (rash, fever, chills) or an allergic reaction   The transfusion increases your risks of getting infection or cancer coming back   The transfusion can increase the time you have to stay in the hospital   The transfusion can potentially cause death if the wrong blood is given or your body rejects the blood   Before blood is transfused, it is tested again to make sure it is the correct type  There are other options than getting blood or blood products from other people and they include:    Drugs which can decrease bleeding   Drugs which can cause your body to make more blood (used in elective procedures with advance notice)   Autologous (your own blood) donation (needs pre-arrangement)   No transfusion  If you exercise your right to refuse to be transfused with blood or blood products; these things listed below, among others, could happen to you:   Your body may not get enough oxygen and suffer damage   You may have a higher chance of bleeding   You may limit other options for your condition   You may die from losing too much blood

## 2021-08-26 NOTE — Progress Notes (Unsigned)
Pre operative teaching completed.  Reviewed medications and instructed to stop ibuprofen 5 days prior to surgery.  Tylenol is ok. Pre operative teaching sheet given to patient as below.  Answered all questions and concerns.  Patient verbalized understanding of [facility] procedure and pre operative instructions.         PRIOR TO SURGERY         The surgical scheduler will call /or my chart message you with the date of your surgery along with any information you may need prior to the surgical date.         Five days before surgery, please STOP taking if surgeon does not specify:  . Anti-inflammatory medications (Ibuprofen, Motrin, Advil, Mobic, Meloxicam, Aleve, Naproxen, Voltaren, etc.)  . Vitamins and herbal supplements, including herbal teas   . YOU MAY TAKE ACETAMINOPHEN (TYLENOL) as needed    FOLLOW YOUR SURGEON'S INSTRUCTIONS IF DIFFERENT THAN ABOVE.    DAY BEFORE SURGERY    . Staff from [facility] will call you by 6pm the evening before the surgery to inform you of your arrival time.  The call will be Friday afternoon if your surgery is on a Monday. If you have not received a phone call by 6pm, please phone [phone] and ask to be transferred to the Main OR Desk.  . Please arrange for transportation to and from the hospital. You must have a responsible adult to stay with you after your surgery.  . Do not eat anything after midnight the night before your surgery.   . Clear liquids are encouraged from midnight until 2 hours before your scheduled surgery. Examples include: Gatorade or water.  If you have been instructed to drink fluids by your surgeon please do as directed.  . If you have been instructed, please perform your skin prep as directed.                                                                                         DAY OF SURGERY    DO NOT CONSUME FOOD OF ANY KIND.  . Only Gatorade or water are encouraged from midnight until 2 hours before your scheduled surgery. Or as DIRECTED BY YOUR  SURGEON  . Please bring photo identification and your insurance card with you for registration.  . If you are female, please be prepared to provide a urine sample on arrival to the preoperative area unless you are one-year post menopause or have had a hysterectomy.   . DO NOT WEAR: JEWELRY, MAKEUP, DARK NAIL POLISH, HAIR PINS, BODY LOTION OR SCENTS.    Marland Kitchen Please understand that rings and body piercings must be removed prior to surgery. If they are not removed your surgery is at risk of being delayed or cancelled. Please see a jeweler before your surgery if you cannot remove an item yourself  . Please do not bring valuables/electronics/cash with you on the day of surgery.  [facility] is not responsible for loss/damage/theft of these items.  Thank you.  . If wearing eyeglasses, please bring a case.    DO NOT WEAR CONTACT LENSES.  . You may shower, brush your teeth, and  use deodorant.  . If you have been instructed, please perform your skin prep as directed.   Marland Kitchen PLEASE BRING YOUR CPAP MACHINE INTO THE PREOPERATIVE AREA TO BE CHECKED IF YOU ARE STAYING OVERNIGHT, OTHERWISE YOU MAY LEAVE IT IN YOUR VEHICLE.  Marland Kitchen Please bring any medical supplies or equipment requested by your surgeon (Example: Back/neck brace).  . Patients are permitted 2 visitors at a time.   All visitors must be 45 yrs of age or older.    MEDICATIONS: DAY OF SURGERY    . Take your medications with a as directed by your surgeon.  Marland Kitchen Anxiety and pain medications may be taken as prescribed at any time prior to arrival.   . Bring your inhalers and eye drops on the day of surgery to be used as prescribed.  . Medications from the [facility] will be administered to you under the direction of your surgeon. Please leave your prescriptions at home.      AT THE HOSPITAL ON THE DAY OF SURGERY    . Park in the [employer].  Enter the building through the [employer].  Please stop at the Information Desk so that they may direct you to Surgery  Center on [address].  Wynelle Fanny your belongings in the car (except for your CPAP) and your visitors may bring them to your room after surgery.    .      Directions to [facility], [address]    From the [place]                           Take [address] [place] - Take exit [address] ([place]) Chemical engineer onto [address] toward [place], bear right onto [address], [facility] will be on your right (about 1.5 miles)  From the [place]                          Take [address] [place] - Take exit [address] ([place]) Chemical engineer onto [address] toward [place], bear right onto [place], [facility] will be on your right (about 1.5 miles)                                        From the [place]                      T           [address]- Take exit [address] ([address]) turn left on to [address]. Turn Right on to [facility] Turn left on to [facility] will be on your left.  From the [place] Take [address] [place] to [address] [place], take exit [address] ([place]) turn left onto [address] toward [place], bear right onto [address], [facility] will be on your right (about 1.5 miles)

## 2021-08-27 ENCOUNTER — Telehealth: Payer: Self-pay

## 2021-08-27 ENCOUNTER — Telehealth: Payer: Self-pay | Admitting: Gynecologic Oncology

## 2021-08-27 NOTE — Telephone Encounter (Signed)
No PA required for [address] outpt at [facility] with Dr Wayne Both  verified on Exc Portal

## 2021-08-27 NOTE — Telephone Encounter (Signed)
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

## 2021-08-27 NOTE — Telephone Encounter (Signed)
Returned call to [name] who would like to proceed to hysterectomy as she is done with child bearing and her tubes are tied.  Given her cardiac history and recent DVT she does not want to have to go under anesthesia twice.  Instructed Kesa it is standard medical practice to have the [facility] biopsy done prior to the hysterectomy but I will present her concerns to Dr. Wayne Both and get back to her.  Patient appreciative of call.

## 2021-08-28 ENCOUNTER — Telehealth: Payer: Self-pay

## 2021-08-28 NOTE — Telephone Encounter (Signed)
SWP  surg 7/27    Labs are entered, needs to be drawn   Needs EKG also, it's ordered     Cardiology will drop a note into her chart    Post op 8/9 @ 10:15 with BDB

## 2021-08-31 ENCOUNTER — Other Ambulatory Visit
Admission: RE | Admit: 2021-08-31 | Discharge: 2021-08-31 | Disposition: A | Discharge status: Home / Self Care | Payer: Medicaid Other | Source: Ambulatory Visit | Attending: Pathology | Admitting: Pathology

## 2021-08-31 DIAGNOSIS — R87619 Unspecified abnormal cytological findings in specimens from cervix uteri: Secondary | ICD-10-CM | POA: Insufficient documentation

## 2021-08-31 LAB — MEDICAL CYTOLOGY

## 2021-09-01 ENCOUNTER — Other Ambulatory Visit: Payer: Self-pay | Admitting: Geriatric Medicine

## 2021-09-01 DIAGNOSIS — R55 Syncope and collapse: Secondary | ICD-10-CM
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
## 2021-09-07 ENCOUNTER — Other Ambulatory Visit
Admission: RE | Admit: 2021-09-07 | Discharge: 2021-09-07 | Disposition: A | Discharge status: Home / Self Care | Payer: Medicaid Other | Source: Ambulatory Visit | Attending: Gynecologic Oncology | Admitting: Gynecologic Oncology

## 2021-09-07 DIAGNOSIS — Z01818 Encounter for other preprocedural examination: Secondary | ICD-10-CM | POA: Insufficient documentation

## 2021-09-07 DIAGNOSIS — R87619 Unspecified abnormal cytological findings in specimens from cervix uteri: Secondary | ICD-10-CM | POA: Insufficient documentation

## 2021-09-07 LAB — CBC AND DIFFERENTIAL
Baso # K/uL: 0 10*3/uL (ref 0.0–0.1)
Basophil %: 0.4 %
Eos # K/uL: 0.2 10*3/uL (ref 0.0–0.4)
Eosinophil %: 3 %
Hematocrit: 36 % (ref 34–45)
Hemoglobin: 11.7 g/dL (ref 11.2–15.7)
IMM Granulocytes #: 0 10*3/uL (ref 0.0–0.0)
IMM Granulocytes: 0.1 %
Lymph # K/uL: 2.2 10*3/uL (ref 1.2–3.7)
Lymphocyte %: 32.7 %
MCH: 29 pg (ref 26–32)
MCHC: 32 g/dL (ref 32–36)
MCV: 91 fL (ref 79–95)
Mono # K/uL: 0.5 10*3/uL (ref 0.2–0.9)
Monocyte %: 6.7 %
Neut # K/uL: 3.8 10*3/uL (ref 1.6–6.1)
Nucl RBC # K/uL: 0 10*3/uL (ref 0.0–0.0)
Nucl RBC %: 0 /100 WBC (ref 0.0–0.2)
Platelets: 296 10*3/uL (ref 160–370)
RBC: 4 MIL/uL (ref 3.9–5.2)
RDW: 13.7 % (ref 11.7–14.4)
Seg Neut %: 57.1 %
WBC: 6.7 10*3/uL (ref 4.0–10.0)

## 2021-09-07 LAB — COMPREHENSIVE METABOLIC PANEL
ALT: 19 U/L (ref 0–35)
AST: 17 U/L (ref 0–35)
Albumin: 4.3 g/dL (ref 3.5–5.2)
Alk Phos: 54 U/L (ref 35–105)
Anion Gap: 9 (ref 7–16)
Bilirubin,Total: 0.4 mg/dL (ref 0.0–1.2)
CO2: 25 mmol/L (ref 20–28)
Calcium: 8.9 mg/dL (ref 8.8–10.2)
Chloride: 104 mmol/L (ref 96–108)
Creatinine: 0.74 mg/dL (ref 0.51–0.95)
Glucose: 99 mg/dL (ref 60–99)
Lab: 10 mg/dL (ref 6–20)
Potassium: 3.7 mmol/L (ref 3.3–4.6)
Sodium: 138 mmol/L (ref 133–145)
Total Protein: 6.7 g/dL (ref 6.3–7.7)
eGFR BY CREAT: 104 *

## 2021-09-07 LAB — APTT: aPTT: 30.1 s (ref 25.8–37.9)

## 2021-09-07 LAB — PROTIME-INR
INR: 1 (ref 0.9–1.1)
Protime: 11.8 s (ref 10.0–12.9)

## 2021-09-09 ENCOUNTER — Other Ambulatory Visit: Payer: Self-pay

## 2021-09-09 ENCOUNTER — Ambulatory Visit: Payer: Medicaid Other | Admitting: Cardiology

## 2021-09-09 VITALS — BP 108/60 | HR 51 | Ht 62.0 in | Wt 135.0 lb

## 2021-09-09 DIAGNOSIS — Z789 Other specified health status: Secondary | ICD-10-CM

## 2021-09-09 DIAGNOSIS — R002 Palpitations: Secondary | ICD-10-CM

## 2021-09-09 DIAGNOSIS — I471 Supraventricular tachycardia: Secondary | ICD-10-CM

## 2021-09-09 DIAGNOSIS — R9431 Abnormal electrocardiogram [ECG] [EKG]: Secondary | ICD-10-CM

## 2021-09-09 DIAGNOSIS — I4729 Other ventricular tachycardia: Secondary | ICD-10-CM

## 2021-09-09 LAB — EKG 12-LEAD
P: 26 deg
PR: 168 ms
QRS: 57 deg
QRSD: 86 ms
QT: 439 ms
QTc: 416 ms
Rate: 54 {beats}/min
T: 71 deg

## 2021-09-09 NOTE — Patient Instructions (Addendum)
Routine heart rhythm evaluation.  Today's EKG shows 54 bpm  You have had some palpitations    Plan  Follow up in 12 months, sooner if indicated.  Continue current medications.  Call with any questions or concerns [phone] or MyChart.    Randa Spike, NP

## 2021-09-09 NOTE — Anesthesia Preprocedure Evaluation (Addendum)
Anesthesia Pre-operative History and Physical for Danielle Hall    Highlighted Issues for this Procedure:  41 y.o. female with CIN III (cervical intraepithelial neoplasia III) (D06.9) presenting for Procedure(s) (LRB):  CONE BIOPSY, CERVIX, USING LEEP (N/A) by Surgeon(s):  Rosina Lowenstein, MD scheduled for 60 minutes.    Past medical history otherwise notable for:  - BMI 25   -  Atrial fibrillation, AVNRT (AV nodal re-entry tachycardia)   - Depression  - DVT (deep venous thrombosis) (2006).    escitalopram (LEXAPRO) 5 mg tablet  omeprazole (PRILOSEC) 20 mg capsule  ibuprofen (ADVIL,MOTRIN) 800 mg tablet  metoprolol tartrate (LOPRESSOR) 25 mg tablet  clonazePAM (KLONOPIN) 0.5 MG tablet    Pertinent allergies:   -- Erythromycin --Unknown-childhood allergy per pt  -- Sulfa Antibiotics --   Unknown childhood allergy    Prior anesthetic records:     10/21/16 - Ablation for SVT - MAC with nasal cannula no complications     Notable recent labs:  Hematology:   Results in Past 730 Days        Result Component        Current Result       Previous Result       Hemoglobin              11.7 (09/07/2021)        Not in Time Range        Hematocrit              36 (09/07/2021)          Not in Time Range        Platelets               296 (09/07/2021)         Not in Time Range          Chemistry:   Results in Past 730 Days        Result Component        Current Result       Previous Result       Potassium               3.7 (09/07/2021)         Not in Time Range        Creatinine              0.74 (09/07/2021)        Not in Time Range            Coagulation Studies:   Results in Past 730 Days        Result Component        Current Result       Previous Result       aPTT                    30.1 (09/07/2021)        Not in Time Range          INR                     1.0 (09/07/2021)         Not in Time Range                Stress Test/Echocardiography:  ECHO COMPLETE 03/01/2018    - Interpretation Summary -  Normal LVEF without significant  regional wall motion abnormalities. No significant valvular abnormalities. Normal right heart size and function with estimated  normal RV systolic pressures.    Electrophysiology/AICD/Pacer:  Judithann Graves - 48 HOUR 03/15/2018    - Narrative -  Table formatting from the original result was not included.     Normal sinus rhythm   Rare APCs and PVCs   No significant arrhythmias   No symptoms reported    Holter Summary    Total Beats 214,781 Recording Date 03/08/18  Length Recorded 48 hours 33 minutes Analysis Date 03/14/18    Overall Rates  Maximum HR 115 bpm on 03/09/18 21:08  Mean HR 74 bpm  Minimum HR 53 bpm on 03/09/18 00:47    Ectopy    PVC Beats   PAC Beats  Count 4  Count 3  Percent 0.00 %  Percent 0.00 %  Max/Hr 2 on 03/08/18 23:0  Max/Hr 1 on 03/09/18 17:0    Pauses 0 Longest  on    Ventricular Arrhythmias Supraventricular Arrhythmias    VT   SVT 0  Longest  on    Longest  on  Max Rate  on    Max Rate  on  Couplet 0  PAC Couplet 0  Triplet 0  R on T 0  Bigeminy 0  Trigeminy 0    Symptoms/Comments:  Technician interpretation:  1. Baseline rhythm is sinus/tachycardia.  2. Rare atrial and ventricular ectopy.  3. There were no symptoms reported. Maudie Flakes. CT.      Holter Conclusion    .  Marland Kitchen  Anesthesia Evaluation Information Source: patient, records     ANESTHESIA HISTORY  Pertinent(-):  No History of anesthetic complications or Family hx of anesthetic complications    GENERAL     Denies general issues  Pertinent (-):  No history of anesthetic complications or Family Hx of Anesthetic Complications    HEENT     Denies HEENT issues PULMONARY     Denies pulmonary issues  Pertinent(-):  No smoking, asthma, recent URI, sleep apnea or COPD    CARDIOVASCULAR  Good(4+METs) Exercise Tolerance    + Hx of Dysrhythmias (s/p ablation)          atrial fib, SVT  Pertinent(-):  No hypertension    GI/HEPATIC/RENAL   NPO: > 8hrs ago (solids) and > 2hrs ago (clears)    Pertinent(-):  No liver  issues or renal issues   NEURO/PSYCH/ORTHO    + Neuropsychiatric Issues  Pertinent(-):  No headaches, chronic pain, seizures, cerebrovascular event or neuromuscular disease    ENDO/OTHER  Pertinent(-):  No diabetes mellitus, thyroid disease    HEMATOLOGIC     Denies hematologic issues         Physical Exam    Airway            Mouth opening: normal            Mallampati: III            TM distance (fb): >3 FB            Neck ROM: full  Dental   Normal Exam   Cardiovascular           Rhythm: regular           Rate: normal  No murmur         Pulmonary     breath sounds clear to auscultation    Mental Status     oriented to person, place and time         ________________________________________________________________________  PLAN  ASA Score  2  Anesthetic Plan general       Induction (routine IV) General Anesthesia/Sedation Maintenance Plan (inhaled agents and IV bolus);  Airway Manipulation (none); Airway (LMA); Line ( use current access); Monitoring (standard ASA); Positioning (supine and lithotomy); PONV Plan (dexamethasone and ondansetron); Pain (per surgical team); PostOp (PACU)Standard Attestation    Informed Consent     Risks:         Risks discussed were commensurate with the plan listed above with the following specific points: sore throat and N/V, Damage to: eyes, nerves and teeth, allergic Rx and unexpected serious injury.    Anesthetic Consent:         Anesthetic plan (and risks as noted above) were discussed with patient    Plan also discussed with team members including:       resident    Responsible Anesthesia Provider Attestation:  I attest that the patient or proxy understands and accepts the risks and benefits of the anesthesia plan. I also attest that I have personally performed a pre-anesthetic examination and evaluation, and prescribed the anesthetic plan for this particular location within 48 hours prior to the anesthetic as documented. Arvin Collard, MD  09/10/21, 2:14 PM

## 2021-09-10 ENCOUNTER — Ambulatory Visit: Payer: Medicaid Other

## 2021-09-10 ENCOUNTER — Other Ambulatory Visit: Payer: Self-pay

## 2021-09-10 ENCOUNTER — Encounter
Admission: RE | Disposition: A | Discharge status: Home / Self Care | Payer: Self-pay | Source: Ambulatory Visit | Attending: Gynecologic Oncology

## 2021-09-10 ENCOUNTER — Ambulatory Visit
Admission: RE | Admit: 2021-09-10 | Discharge: 2021-09-10 | Disposition: A | Discharge status: Home / Self Care | Payer: Medicaid Other | Source: Ambulatory Visit | Attending: Gynecologic Oncology | Admitting: Gynecologic Oncology

## 2021-09-10 DIAGNOSIS — D069 Carcinoma in situ of cervix, unspecified: Secondary | ICD-10-CM

## 2021-09-10 DIAGNOSIS — Z9889 Other specified postprocedural states: Secondary | ICD-10-CM

## 2021-09-10 DIAGNOSIS — I4891 Unspecified atrial fibrillation: Secondary | ICD-10-CM | POA: Insufficient documentation

## 2021-09-10 DIAGNOSIS — N72 Inflammatory disease of cervix uteri: Secondary | ICD-10-CM | POA: Insufficient documentation

## 2021-09-10 DIAGNOSIS — R87619 Unspecified abnormal cytological findings in specimens from cervix uteri: Secondary | ICD-10-CM | POA: Insufficient documentation

## 2021-09-10 HISTORY — PX: PR CONIZATION CERVIX W/WO D&C RPR ELTRD EXC: 57522

## 2021-09-10 LAB — POCT URINE PREGNANCY
Lot #: 626680
Preg Test,UR POC: NEGATIVE

## 2021-09-10 SURGERY — CONIZATION, CERVIX, USING LEEP
Anesthesia: General | Site: Cervix | Wound class: Clean Contaminated

## 2021-09-10 MED ORDER — FERRIC SUBSULFATE PASTE *I*
CUTANEOUS | Status: AC
Start: 2021-09-10 — End: 2021-09-10
  Filled 2021-09-10: qty 1

## 2021-09-10 MED ORDER — IODINE STRONG (LUGOL'S) PO SOLN *WRAPPED*
ORAL | Status: AC
Start: 2021-09-10 — End: 2021-09-10
  Filled 2021-09-10: qty 14

## 2021-09-10 MED ORDER — ACETAMINOPHEN 325 MG PO TABS *I*
650.0000 mg | ORAL_TABLET | Freq: Four times a day (QID) | ORAL | Status: DC
Start: 2021-09-10 — End: 2021-09-10

## 2021-09-10 MED ORDER — FENTANYL CITRATE 50 MCG/ML IJ SOLN *WRAPPED*
INTRAMUSCULAR | Status: AC
Start: 2021-09-10 — End: 2021-09-10
  Filled 2021-09-10: qty 2

## 2021-09-10 MED ORDER — FENTANYL CITRATE 50 MCG/ML IJ SOLN *WRAPPED*
INTRAMUSCULAR | Status: DC | PRN
Start: 2021-09-10 — End: 2021-09-10
  Administered 2021-09-10: 50 ug via INTRAVENOUS

## 2021-09-10 MED ORDER — SODIUM CHLORIDE 0.9 % 25 ML IV SOLN *I*
5.0000 mg | Freq: Once | INTRAVENOUS | Status: DC | PRN
Start: 2021-09-10 — End: 2021-09-10

## 2021-09-10 MED ORDER — VASOPRESSIN 20 UNIT/ML IJ SOLN *WRAPPED*
INTRAMUSCULAR | Status: DC | PRN
Start: 2021-09-10 — End: 2021-09-10
  Administered 2021-09-10: 8 mL via INTRAMUSCULAR

## 2021-09-10 MED ORDER — ONDANSETRON HCL 2 MG/ML IV SOLN *I*
4.0000 mg | Freq: Once | INTRAMUSCULAR | Status: DC | PRN
Start: 2021-09-10 — End: 2021-09-10

## 2021-09-10 MED ORDER — IBUPROFEN 600 MG PO TABS *I*
600.0000 mg | ORAL_TABLET | Freq: Three times a day (TID) | ORAL | 0 refills | Status: AC | PRN
Start: 2021-09-10 — End: 2021-10-10
  Filled 2021-09-10: qty 90, 30d supply, fill #0

## 2021-09-10 MED ORDER — ACETAMINOPHEN 500 MG PO TABS *I*
500.0000 mg | ORAL_TABLET | Freq: Four times a day (QID) | ORAL | 0 refills | Status: AC | PRN
Start: 2021-09-10 — End: 2021-10-10
  Filled 2021-09-10: qty 30, 7d supply, fill #0

## 2021-09-10 MED ORDER — ACETIC ACID 5 % SOLN *I*
Status: AC
Start: 2021-09-10 — End: 2021-09-10
  Filled 2021-09-10: qty 50

## 2021-09-10 MED ORDER — LIDOCAINE HCL 2 % IJ SOLN *I*
INTRAMUSCULAR | Status: DC | PRN
Start: 2021-09-10 — End: 2021-09-10
  Administered 2021-09-10: 60 mg via INTRAVENOUS

## 2021-09-10 MED ORDER — DEXAMETHASONE SODIUM PHOSPHATE 4 MG/ML INJ SOLN *WRAPPED*
INTRAMUSCULAR | Status: DC | PRN
Start: 2021-09-10 — End: 2021-09-10
  Administered 2021-09-10: 4 mg via INTRAVENOUS

## 2021-09-10 MED ORDER — EPHEDRINE 5MG/ML IN NS IV/IJ *WRAPPED*
INTRAMUSCULAR | Status: DC | PRN
Start: 2021-09-10 — End: 2021-09-10
  Administered 2021-09-10: 10 mg via INTRAVENOUS

## 2021-09-10 MED ORDER — ACETIC ACID 5 % SOLN *I*
Status: DC | PRN
Start: 2021-09-10 — End: 2021-09-10
  Administered 2021-09-10: 30 mL via TOPICAL

## 2021-09-10 MED ORDER — PROPOFOL 10 MG/ML IV EMUL (INTERMITTENT DOSING) WRAPPED *I*
INTRAVENOUS | Status: DC | PRN
Start: 2021-09-10 — End: 2021-09-10
  Administered 2021-09-10: 150 mg via INTRAVENOUS

## 2021-09-10 MED ORDER — HALOPERIDOL LACTATE 5 MG/ML IJ SOLN *I*
0.5000 mg | Freq: Once | INTRAMUSCULAR | Status: DC | PRN
Start: 2021-09-10 — End: 2021-09-10

## 2021-09-10 MED ORDER — ACETAMINOPHEN 500 MG PO TABS *I*
1000.0000 mg | ORAL_TABLET | Freq: Once | ORAL | Status: AC
Start: 2021-09-10 — End: 2021-09-10
  Administered 2021-09-10: 1000 mg via ORAL
  Filled 2021-09-10: qty 2

## 2021-09-10 MED ORDER — KETOROLAC TROMETHAMINE 30 MG/ML IJ SOLN *I*
15.0000 mg | Freq: Once | INTRAMUSCULAR | Status: DC
Start: 2021-09-10 — End: 2021-09-10

## 2021-09-10 MED ORDER — HYDROMORPHONE HCL PF 1 MG/ML IJ SOLN *WRAPPED*
0.5000 mg | INTRAMUSCULAR | Status: DC | PRN
Start: 2021-09-10 — End: 2021-09-10
  Administered 2021-09-10 (×2): 0.5 mg via INTRAVENOUS
  Filled 2021-09-10 (×2): qty 0.5

## 2021-09-10 MED ORDER — FERRIC SUBSULFATE PASTE *I*
Status: DC | PRN
Start: 2021-09-10 — End: 2021-09-10
  Administered 2021-09-10: 2 mL

## 2021-09-10 MED ORDER — MIDAZOLAM HCL 1 MG/ML IJ SOLN *I* WRAPPED
INTRAMUSCULAR | Status: AC
Start: 2021-09-10 — End: 2021-09-10
  Filled 2021-09-10: qty 2

## 2021-09-10 MED ORDER — VASOPRESSIN 20 UNIT/ML IJ SOLN *WRAPPED*
Status: AC
Start: 2021-09-10 — End: 2021-09-10
  Filled 2021-09-10: qty 1

## 2021-09-10 MED ORDER — KETOROLAC TROMETHAMINE 30 MG/ML IJ SOLN *I*
30.0000 mg | Freq: Four times a day (QID) | INTRAMUSCULAR | Status: DC
Start: 2021-09-10 — End: 2021-09-10

## 2021-09-10 MED ORDER — GLYCOPYRROLATE 0.2 MG/ML IJ SOLN *WRAPPED*
INTRAMUSCULAR | Status: DC | PRN
  Administered 2021-09-10 (×2): .2 mg via INTRAVENOUS

## 2021-09-10 MED ORDER — IBUPROFEN 600 MG PO TABS *I*
600.0000 mg | ORAL_TABLET | Freq: Four times a day (QID) | ORAL | Status: DC | PRN
Start: 2021-09-11 — End: 2021-09-10

## 2021-09-10 MED ORDER — ONDANSETRON HCL 2 MG/ML IV SOLN *I*
INTRAMUSCULAR | Status: DC | PRN
Start: 2021-09-10 — End: 2021-09-10
  Administered 2021-09-10: 4 mg via INTRAVENOUS

## 2021-09-10 MED ORDER — LACTATED RINGERS IV SOLN *I*
125.0000 mL/h | INTRAVENOUS | Status: DC
Start: 2021-09-10 — End: 2021-09-10
  Administered 2021-09-10: 125 mL/h via INTRAVENOUS

## 2021-09-10 MED ORDER — MIDAZOLAM HCL 1 MG/ML IJ SOLN *I* WRAPPED
INTRAMUSCULAR | Status: DC | PRN
Start: 2021-09-10 — End: 2021-09-10
  Administered 2021-09-10: 2 mg via INTRAVENOUS

## 2021-09-10 SURGICAL SUPPLY — 16 items
APPLICATOR SCOPETTE COTTON TIP (Supply) ×4 IMPLANT
ELECTRODE ELECTROSURGICAL BALL 5MM (Supply) ×2 IMPLANT
GLOVE BIOGEL PI PRO-FIT SZ 7.0 LF (Glove) ×2 IMPLANT
LOOP ELECTRODE 15MM X 15MM (Supply) ×2 IMPLANT
NEEDLE QUINCKE SPI 22G X 3.5IN (Needle) ×2 IMPLANT
NEEDLE SFTYGLD 23X1-1/2 RB TW (Needle) ×2 IMPLANT
PACK CUSTOM MINOR VAGINAL PACK (Pack) ×2 IMPLANT
PENCIL ROCKER SWITCH W/HOLSTER SS DISP (Supply) ×2 IMPLANT
SLEEVE COMP KNEE MED BLENDED (Supply) ×2 IMPLANT
SOL SOD CHL .9 PCT 20CC (Drug) ×2 IMPLANT
SOL SODIUM CHLORIDE IRRIG 1000ML BTL (Solution) ×2 IMPLANT
STRAP REPLCMT LITHOTOMY 10 X 1.5IN (Supply) ×4 IMPLANT
SUTR SILK 2-0 FS 18 IN BLACK (Suture) ×2 IMPLANT
SYRINGE LUERLOCK CNTL 10CC (Syringe) ×2 IMPLANT
SYRINGE TB SAFETYGLIDE 1CC 27G X 1/2IN (Syringe) ×2 IMPLANT
TUBING NONCONDUC CONN 12FT X 3/16IN (Tubing) ×2 IMPLANT

## 2021-09-10 NOTE — Interval H&P Note (Signed)
UPDATES TO PATIENT'S CONDITION on the DAY OF SURGERY/PROCEDURE    I. Updates to Patient's Condition (to be completed by a provider privileged to complete a H&P, following reassessment of the patient by the provider):    Day of Surgery/Procedure Update:  History  History reviewed and no change    Physical  Physical exam updated and no change            II. Procedure Readiness   I have reviewed the patient's H&P and updated condition. By completing and signing this form, I attest that this patient is ready for surgery/procedure.    III. Attestation   I have reviewed the updated information regarding the patient's condition and it is appropriate to proceed with the planned surgery/procedure.      Rosina Lowenstein, MD as of 1:24 PM 09/10/2021

## 2021-09-10 NOTE — Anesthesia Postprocedure Evaluation (Signed)
Anesthesia Post-Op Note    Patient: Danielle Hall, Danielle Hall    Procedure(s) Performed:  Procedure Summary  Date:  09/10/2021 Anesthesia Start: 09/10/2021  1:59 PM Anesthesia Stop: 09/10/2021  2:33 PM Room / Location:  [id number] MA[facility] OR   Procedure(s):  CONE BIOPSY, CERVIX, USING LEEP Diagnosis:  CIN III (cervical intraepithelial neoplasia III) [D06.9] Surgeon(s):  Rosina Lowenstein, MD  Klasner, Shelva Majestic, MD  Renee Ramus, MD Responsible Anesthesia Provider:  Arvin Collard, MD         Recovery Vitals  BP: 116/64 (09/10/2021  4:12 PM)  Heart Rate: 74 (09/10/2021  4:12 PM)  Heart Rate (via Pulse Ox): 72 (09/10/2021  3:30 PM)  Resp: 16 (09/10/2021  4:12 PM)  Temp: 36.4 C (97.5 F) (09/10/2021  4:12 PM)  SpO2: 100 % (09/10/2021  4:12 PM)   0-10 Scale: 2 (09/10/2021  4:12 PM)    Anesthesia type:  general  Complications Noted During Procedure or in PACU:  None   Comment:    Patient Location:  ASC  Level of Consciousness:    Recovered to baseline  Patient Participation:     Able to participate  Temperature Status:    Normothermic  Oxygen Saturation:    Within patient's normal range  Cardiac Status:   Within patient's normal range  Fluid Status:    Stable  Airway Patency:     Yes  Pulmonary Status:    Baseline  Pain Management:    Adequate analgesia  Nausea and Vomiting:    Controlled    Post Op Assessment:    Tolerated procedure well  Responsible Anesthesia Provider Attestation:  All indicated post anesthesia care provided       -

## 2021-09-10 NOTE — Anesthesia Case Conclusion (Signed)
CASE CONCLUSION  Emergence  Actions:  Suctioned and deep LMA removal  Criteria Used for Airway Removal:  Adequate Tv & RR and acceptable O2 saturation  Assessment:  Routine  Transport  Directly to: PACU  Airway:  Facemask  Oxygen Delivery:  10 lpm  Position:  Supine  Patient Condition on Handoff  Level of Consciousness:  Moderately sedated  Patient Condition:  Stable  Handoff Report to:  RN

## 2021-09-10 NOTE — Progress Notes (Signed)
Verbalizes understanding of all discharge instructions, symptom management and follow up care.

## 2021-09-10 NOTE — Discharge Instructions (Signed)
DISCHARGE INSTRUCTIONS       Dilation & Curettage (D&C), LEEP  09/10/2021     You have received sedative medication and/or general anesthesia which may make you drowsy for as long as 24 hours:     A)  DO NOT drive or operate any machinery for 24 hours     B)  DO NOT drink alcoholic beverages for 24 hours     C)  DO NOT make major decisions, sign contracts, etc for 24 hours  Please continue to adhere to these precautions if you are taking narcotic medication.    Discomfort after surgery:  - It is normal to have mild to moderate abdominal cramping for a few days to a few weeks  - Use acetaminophen (Tylenol) or ibuprofen for pain unless you are allergic to these drugs    Surgery site care after surgery  - It is normal to have some vaginal spotting or bleeding for up to 2 weeks  - If you are still having menstrual periods, your next period may be earlier or later than expected    Activity after surgery  - You should rest the day of the procedure  - You can resume normal activities the day following your procedure  - Shower: You may shower  - Driving: you may drive after 24 hours  - Activity: rest today  - Intercourse: Nothing in the vagina (no tampons, douching or intercourse) for 4-6 weeks    Diet after surgery  - You may feel nauseated from the surgery or anesthesia.  Advance diet as tolerated.    Medications  See medication reconciliation sheet    When to call your doctor:  - fever greater than 100 degrees F and/or chills  - nausea and vomiting  - inability to urinate  - severe cramping or abdominal pain not relieved by acetaminophen (Tylenol) or ibuprofen  - foul smelling discharge  - heavy vaginal bleeding (soaking through 1 pad every 1-2 hours)  - with any other concerns or questions

## 2021-09-10 NOTE — Anesthesia Procedure Notes (Signed)
---------------------------------------------------------------------------------------------------------------------------------------    AIRWAY   GENERAL INFORMATION AND STAFF       Date of Procedure: 09/10/2021 2:13 PM  CONDITION PRIOR TO MANIPULATION     Current Airway/Neck Condition:  Normal        For more airway physical exam details, see Anesthesia PreOp Evaluation  AIRWAY METHOD     Patient Position:  Sniffing    Preoxygenated: yes      Maintained In-Line Stability: not needed, normal c-spine condition          To see details of medications used, see MAR    Induction: IV    Number of Attempts at Approach:  1    Number of Other Approaches Attempted:  0  FINAL AIRWAY DETAILS    Final Airway Type:  LMA    Final LMA: i-gel    LMA Size: 4  ----------------------------------------------------------------------------------------------------------------------------------------

## 2021-09-10 NOTE — Op Note (Addendum)
Operative Note (Surgical Case/Log ID: [id number])       Date of Surgery: 09/10/2021       Surgeons: Surgeon(s) and Role:     * Rosina Lowenstein, MD - Primary     * Klasner, Shelva Majestic, MD - Resident - Assisting     * Renee Ramus, MD - Fellow   Assistants:         Pre-op Diagnosis: Atypical glandular cells on pap smear       Post-op Diagnosis: Atypical glandular cells on pap       Procedure(s) Performed: LEEP CONIZATION OF THE CERVIX  D&C       Anesthesia Type: GEN       Fluid Totals: I/O this shift:  07/27 0600 - 07/27 2259  In: - (0 mL/kg)   Out: 2 (0 mL/kg) [Blood:2]  Net: -2  Weight: 59 kg        Estimated Blood Loss: 2 mL       Specimens to Pathology:  ID Type Source Tests Collected by Time Destination   A : Cone biopsy TISSUE Cervix SURGICAL PATHOLOGY Beatris Si, RN 09/10/2021 1414    B : Endometrial curettings UTERINE Uterus SURGICAL PATHOLOGY Beatris Si, RN 09/10/2021 1416           Temporary Implants:        Packing:                 Patient Condition: good       Indications: 41 year old with CIN III and AGS found on Pap       Findings (Including unexpected complications): Normal-appearing cervix with no obvious lesions or masses. No acetowhite changes noted.     Description of Procedure:    After informed consent was obtained, the patient was taken to the OR for a LEEP and D&C. After adequate anesthesia was achieved, she was prepped and draped in the usual sterile fashion. After a timeout the procedure was started. Exam noted the aforementioned findings. The LEEP wire was attached to electrocautery and a LEEP excision was performed in the usual fashion and the specimen was sent to pathology. Next, the D&C was performed. The cervix was sequentially dilated, paying careful attention as to not perforate the uterus. The sharp curette was then used and the endometrial specimen was sent to pathology. The patient tolerated the procedure well.    Signed:  Renee Ramus, MD  on 09/10/2021 at 2:19 PM

## 2021-09-11 ENCOUNTER — Telehealth: Payer: Self-pay | Admitting: Oncology

## 2021-09-11 ENCOUNTER — Encounter: Payer: Self-pay | Admitting: Gynecologic Oncology

## 2021-09-11 ENCOUNTER — Emergency Department
Admission: EM | Admit: 2021-09-11 | Discharge: 2021-09-11 | Disposition: A | Discharge status: Home / Self Care | Payer: Medicaid Other | Source: Ambulatory Visit | Attending: Physician Assistant | Admitting: Physician Assistant

## 2021-09-11 ENCOUNTER — Other Ambulatory Visit: Payer: Self-pay

## 2021-09-11 DIAGNOSIS — R1310 Dysphagia, unspecified: Secondary | ICD-10-CM | POA: Insufficient documentation

## 2021-09-11 LAB — SURGICAL PATHOLOGY

## 2021-09-11 MED ORDER — LIDOCAINE VISCOUS 2 % MT SOLN *I*
10.0000 mL | Freq: Once | OROMUCOSAL | Status: AC
Start: 2021-09-11 — End: 2021-09-11
  Administered 2021-09-11: 10 mL via OROMUCOSAL
  Filled 2021-09-11: qty 15

## 2021-09-11 MED ORDER — DIPHENHYDRAMINE HCL 12.5 MG/5ML PO WRAPPED *I*
25.0000 mg | Freq: Once | Status: DC
Start: 2021-09-11 — End: 2021-09-11

## 2021-09-11 MED ORDER — ALUM & MAG HYDROXIDE-SIMETH 400-400-40 MG/5ML PO SUSP *I*
10.0000 mL | Freq: Once | ORAL | Status: AC
Start: 2021-09-11 — End: 2021-09-11
  Administered 2021-09-11: 10 mL via ORAL
  Filled 2021-09-11: qty 30

## 2021-09-11 MED ORDER — FAMOTIDINE 20 MG PO TABS *I*
20.0000 mg | ORAL_TABLET | Freq: Once | ORAL | Status: AC
Start: 2021-09-11 — End: 2021-09-11
  Administered 2021-09-11: 20 mg via ORAL
  Filled 2021-09-11: qty 1

## 2021-09-11 MED ORDER — DEXAMETHASONE SODIUM PHOSPHATE 10 MG/ML (FOR ORAL USE) *I*
10.0000 mg | Freq: Once | ORAL | Status: AC
Start: 2021-09-11 — End: 2021-09-11
  Administered 2021-09-11: 10 mg via ORAL
  Filled 2021-09-11: qty 1

## 2021-09-11 NOTE — ED Triage Notes (Signed)
Pt is a 41 year old female who presents to the ED today by car from home for diff swallowing.  Pt states that she had surgery yesterday and used a soft tube for intubation.  Pt is still having trouble swallowing today.    Prehospital medications given: Yes  Analgesia: Tylenol (1000mg  at 8am)

## 2021-09-11 NOTE — Discharge Instructions (Signed)
Trial pepcid twice a day x 14 days    Follow up with your PCP for reevaluation early next week  Return for any worsening symptoms or changes in your symptoms  including any voice change, throat closing sensation, chest pain or vomiting.

## 2021-09-11 NOTE — Telephone Encounter (Signed)
Called patient, she reports side pain but also difficulty swallowing with choking on food. Instructed patient to go to ER for evaluation and patient reported she would    Danielle Maser, NP

## 2021-09-11 NOTE — First Provider Contact (Signed)
ED First Provider Contact Note    Initial provider evaluation performed by  Mallie Darting, PA    Called by NP from the GYN for follow up eval after GYN procedure     Phonation normal  Drinking carbonation  Pain/burning  Posterior pharynx clear    Patient requires further evaluation.     Lenna Sciara Kaitlyn Skowron, PA, 09/11/2021, 5:26 PM     Epsie Walthall E, PA  09/11/21 1726

## 2021-09-12 NOTE — ED Provider Notes (Signed)
History     Chief Complaint   Patient presents with   . Dysphagia       History provided by:  Patient and medical records  Language interpreter used: No      Patient is a 41 year old female presenting today for evaluation of painful swallowing.  Had a cone biopsy procedure by GYN performed yesterday, which involved intubation with an LMA.  Patient states that she has had painful swelling, described as a burning sensation when she attempts to eat.  Has no chest pain or throat pain described otherwise.  No throat closing sensation.  Denies any respiratory difficulty or respiratory distress.  No fevers or chills.  States that she had a nurse practitioner call her for follow-up phone call after her GYN procedure, reported her symptoms to the nurse practitioner who directed her to be evaluated in the emergency department immediately.  She has been able to eat some foods but states that it has a burning sensation as she attempts to swallow.    Medical/Surgical/Family History     Past Medical History:   Diagnosis Date   . Atrial fibrillation    . AVNRT (AV nodal re-entry tachycardia)    . Chalazion of both eyes    . Depression    . DVT (deep venous thrombosis) 2006    post knee surgery        Patient Active Problem List   Diagnosis Code   . A-fib I48.91   . DVT (deep venous thrombosis) I82.409   . AVNRT (AV nodal re-entry tachycardia) I47.1   . Chalazion of both eyes H00.13, H00.16   . Edema of right lower extremity due to peripheral venous insufficiency I87.2   . Abdominal pain R10.9   . S/P LEEP Z98.890            Past Surgical History:   Procedure Laterality Date   . CARDIAC ELECTROPHYSIOLOGY PROCEDURE N/A 10/21/2016    Procedure: Ablation - SVT;  Surgeon: Zetta Bills, MD;  Location: [facility] EP LABS;  Service:    . HERNIA REPAIR     . KNEE SURGERY     . PR CONIZATION CERVIX W/WO D&C RPR ELTRD EXC N/A 09/10/2021    Procedure: CONE BIOPSY, CERVIX, USING LEEP;  Surgeon: Rosina Lowenstein, MD;  Location: [facility] MAIN [facility];  Service:  OBGYN   . PR IMPLANTATION PT-ACTIVATED CARDIAC EVENT RECORDER N/A 10/22/2016    Procedure: Loop Recorder Implant;  Surgeon: Astrid Divine, MD;  Location: [facility] EP LABS;  Service: Cardiovascular   . PR REMOVAL SUBCUTANEOUS CARDIAC RHYTHM MONITOR N/A 07/07/2021    Procedure: Injectable Loop Explant;  Surgeon: Katherine Roan, NP;  Location: [facility] EP LABS;  Service: Cardiovascular   . TUBAL LIGATION       Family History   Problem Relation Age of Onset   . Arrhythmia Paternal Grandmother    . Heart Disease Paternal Grandfather    . No Known Problems Mother    . No Known Problems Father    . No Known Problems Sister    . No Known Problems Brother    . No Known Problems Maternal Aunt    . No Known Problems Maternal Uncle    . No Known Problems Paternal Aunt    . No Known Problems Paternal Uncle    . No Known Problems Maternal Grandmother    . No Known Problems Maternal Grandfather    . No Known Problems Other    . Sudden death Neg Hx    .  Amblyopia (Lazy Eye) Neg Hx    . Blindness Neg Hx    . Cataracts Neg Hx    . Diabetes Neg Hx    . Glaucoma Neg Hx    . Macular degeneration Neg Hx    . Retinal detachment Neg Hx    . Strabismus Neg Hx           Social History     Tobacco Use   . Smoking status: Never   . Smokeless tobacco: Never   Substance Use Topics   . Alcohol use: Yes     Comment: socially   . Drug use: No     Living Situation     Questions Responses    Patient lives with Family    Homeless No    Caregiver for other family member No    External Services None    Employment Employed    Domestic Violence Risk No                Review of Systems   Review of Systems   Reason unable to perform ROS: See HPI.   All other systems reviewed and are negative.      Physical Exam     Triage Vitals     First Recorded BP: 106/52, Resp: 16, Temp: 37.2 C (99 F), Temp src: TEMPORAL Oxygen Therapy SpO2: 100 %, Oximetry Source: Lt Hand, O2 Device: None (Room air), Heart Rate: 68, (09/11/21 1718)  .  First Pain Reported  0-10 Scale: 3,  (09/11/21 1718)       Physical Exam  Vitals and nursing note reviewed.   Constitutional:       General: She is not in acute distress.     Appearance: She is well-developed. She is not toxic-appearing.   HENT:      Head: Normocephalic.      Mouth/Throat:      Tongue: No lesions.      Palate: No mass.      Pharynx: Oropharynx is clear. Uvula midline.      Comments: No sublingual swelling.   Pt able to manage oral secretions.   No muffling of voice.  No trismus.  No drooling.  No stridor    Eyes:      Conjunctiva/sclera: Conjunctivae normal.      Right eye: Right conjunctiva is not injected.      Left eye: Left conjunctiva is not injected.   Pulmonary:      Effort: Pulmonary effort is normal. No respiratory distress.   Musculoskeletal:      Cervical back: Normal range of motion and neck supple.   Neurological:      Mental Status: She is alert and oriented to person, place, and time. Mental status is at baseline.      Comments:        Psychiatric:         Attention and Perception: Attention normal.      Comments: Pt is cooperative with examination.         Medical Decision Making   Patient seen by me on:  09/11/2021    Assessment:  Patient is a 41 year old female presenting today for evaluation of painful swallowing.  Had a cone biopsy procedure by GYN performed yesterday, which involved intubation with an LMA.  Patient states that she has had painful swelling, described as a burning sensation when she attempts to eat.  Has no chest pain or throat pain described otherwise.  No throat closing sensation.  Denies any respiratory difficulty or respiratory distress.  No fevers or chills.States that she had a nurse practitioner call her for follow-up phone call after her GYN procedure, reported her symptoms to the nurse practitioner who directed her to be evaluated in the emergency department immediately. She has been able to eat some foods but states that it has a burning sensation as she attempts to swallow.    Differential  diagnosis:  Gastritis, postintubation inflammation/pain; no evidence to suggest acute airway emergency based on history and exam today.    Plan:  Medications  aluminum & magnesium hydroxide w/simethicone (MAALOX Maximum Strength) suspension 10 mL (10 mLs Oral Given 09/11/21 1747)    And  lidocaine (XYLOCAINE) 2 % mouth solution 10 mL (10 mLs Mouth/Throat Given 09/11/21 1747)  dexAMETHasone (DECADRON) solution 10 mg (10 mg Oral Given 09/11/21 1747)  famotidine (PEPCID) tablet 20 mg (20 mg Oral Given 09/11/21 1746)      ED Course and Disposition:  Patient evaluated, no acute distress.  Phonation normal.  She denies any difficulty with respirations.  She is describing a burning sensation as she attempts to swallow and drink since having her procedure yesterday.  Currently noted to be drinking a carbonated beverage at time of evaluation in the emergency department, without difficulty.  Record review shows that she had a cone biopsy performed, which required intubation for the procedure where she had an LMA utilized for intubation purposes.  There is no documentation of a difficult intubation. I discussed with the patient that I suspect either she has some reflux post procedure causing her discomfort and/or soft tissue irritation causing a lot of her discomfort.  She denies any actual chest pain or neck pain, I do not feel that there is any utility in performing any emergent imaging at this time.  Agreeable at this time to a GI cocktail to include viscous lidocaine and Maalox in addition to oral Decadron.  Patient states that she has been off her omeprazole but I was discussing initiating some Pepcid but she states that she will just resume her omeprazole that was previously prescribed, I recommended a 2-week trial to see if this helps with her symptoms.  Aware to return today symptoms change or worsen including any difficulty with swallowing, maintaining her airway or any other symptoms that concern her and would warrant  reevaluation.  Agreeable to discharge plan home at this time, no questions prior to discharge.              Mallie Darting, PA             Annelie Boak E, PA  09/12/21 1014

## 2021-09-14 ENCOUNTER — Telehealth: Payer: Self-pay | Admitting: Gynecologic Oncology

## 2021-09-14 ENCOUNTER — Encounter: Payer: Self-pay | Admitting: Gynecologic Oncology

## 2021-09-14 ENCOUNTER — Telehealth: Payer: Self-pay

## 2021-09-14 NOTE — Telephone Encounter (Signed)
Follow up call to patient who reports still having difficulty swallowing.  Painful and burning sensation when she does.  Seen in [facility] Emergency Room on Friday. (See Note) Spoke with APP who instructed suggests salt water and baking soda gargles and ibuprofen.  States it will take time for throat to recover.

## 2021-09-16 ENCOUNTER — Other Ambulatory Visit
Admission: RE | Admit: 2021-09-16 | Discharge: 2021-09-16 | Disposition: A | Discharge status: Home / Self Care | Payer: Medicaid Other | Source: Ambulatory Visit | Attending: Internal Medicine | Admitting: Internal Medicine

## 2021-09-16 DIAGNOSIS — R1310 Dysphagia, unspecified: Secondary | ICD-10-CM | POA: Insufficient documentation

## 2021-09-16 DIAGNOSIS — R1013 Epigastric pain: Secondary | ICD-10-CM | POA: Insufficient documentation

## 2021-09-16 LAB — CBC AND DIFFERENTIAL
Baso # K/uL: 0 10*3/uL (ref 0.0–0.1)
Basophil %: 0.3 %
Eos # K/uL: 0.2 10*3/uL (ref 0.0–0.4)
Eosinophil %: 2.2 %
Hematocrit: 39 % (ref 34–45)
Hemoglobin: 12.5 g/dL (ref 11.2–15.7)
IMM Granulocytes #: 0 10*3/uL (ref 0.0–0.0)
IMM Granulocytes: 0.2 %
Lymph # K/uL: 2.4 10*3/uL (ref 1.2–3.7)
Lymphocyte %: 24.3 %
MCH: 29 pg (ref 26–32)
MCHC: 32 g/dL (ref 32–36)
MCV: 91 fL (ref 79–95)
Mono # K/uL: 0.5 10*3/uL (ref 0.2–0.9)
Monocyte %: 5.4 %
Neut # K/uL: 6.6 10*3/uL — ABNORMAL HIGH (ref 1.6–6.1)
Nucl RBC # K/uL: 0 10*3/uL (ref 0.0–0.0)
Nucl RBC %: 0 /100 WBC (ref 0.0–0.2)
Platelets: 306 10*3/uL (ref 160–370)
RBC: 4.3 MIL/uL (ref 3.9–5.2)
RDW: 13.5 % (ref 11.7–14.4)
Seg Neut %: 67.6 %
WBC: 9.7 10*3/uL (ref 4.0–10.0)

## 2021-09-16 LAB — AMYLASE: Amylase: 47 U/L (ref 28–100)

## 2021-09-16 LAB — ALBUMIN: Albumin: 4.6 g/dL (ref 3.5–5.2)

## 2021-09-16 LAB — SEDIMENTATION RATE, AUTOMATED: Sedimentation Rate: 4 mm/hr (ref 0–20)

## 2021-09-16 LAB — LIPASE: Lipase: 33 U/L (ref 13–60)

## 2021-09-16 LAB — SODIUM: Sodium: 139 mmol/L (ref 133–145)

## 2021-09-16 LAB — BUN: Lab: 16 mg/dL (ref 6–20)

## 2021-09-16 LAB — AST: AST: 34 U/L (ref 0–35)

## 2021-09-16 LAB — ALKALINE PHOSPHATASE: Alk Phos: 66 U/L (ref 35–105)

## 2021-09-16 LAB — POTASSIUM: Potassium: 4.2 mmol/L (ref 3.3–4.6)

## 2021-09-16 LAB — T BILI: Bilirubin,Total: 0.4 mg/dL (ref 0.0–1.2)

## 2021-09-16 LAB — ALT: ALT: 39 U/L — ABNORMAL HIGH (ref 0–35)

## 2021-09-16 LAB — CREATININE, SERUM
Creatinine: 0.78 mg/dL (ref 0.51–0.95)
eGFR BY CREAT: 97 *

## 2021-09-16 LAB — GGT: GGT: 35 U/L (ref 5–36)

## 2021-09-17 LAB — TISSUE TRANSGLUT,IGA: tTG,IgA: <0.5 U/mL (ref 0.0–14.9)

## 2021-09-17 LAB — IGA: IgA: 165 mg/dL (ref 70–400)

## 2021-09-18 ENCOUNTER — Ambulatory Visit
Admission: RE | Admit: 2021-09-18 | Discharge: 2021-09-18 | Disposition: A | Discharge status: Home / Self Care | Payer: Medicaid Other | Source: Ambulatory Visit | Attending: Internal Medicine | Admitting: Internal Medicine

## 2021-09-18 ENCOUNTER — Encounter: Payer: Self-pay | Admitting: Internal Medicine

## 2021-09-18 ENCOUNTER — Ambulatory Visit: Payer: Medicaid Other | Admitting: Anesthesiology

## 2021-09-18 ENCOUNTER — Other Ambulatory Visit: Payer: Self-pay

## 2021-09-18 DIAGNOSIS — K2970 Gastritis, unspecified, without bleeding: Secondary | ICD-10-CM | POA: Insufficient documentation

## 2021-09-18 DIAGNOSIS — R1310 Dysphagia, unspecified: Secondary | ICD-10-CM | POA: Insufficient documentation

## 2021-09-18 DIAGNOSIS — K449 Diaphragmatic hernia without obstruction or gangrene: Secondary | ICD-10-CM | POA: Insufficient documentation

## 2021-09-18 DIAGNOSIS — K219 Gastro-esophageal reflux disease without esophagitis: Secondary | ICD-10-CM

## 2021-09-18 DIAGNOSIS — K222 Esophageal obstruction: Secondary | ICD-10-CM | POA: Insufficient documentation

## 2021-09-18 DIAGNOSIS — K2100 Gastro-esophageal reflux disease with esophagitis, without bleeding: Secondary | ICD-10-CM | POA: Insufficient documentation

## 2021-09-18 DIAGNOSIS — R634 Abnormal weight loss: Secondary | ICD-10-CM | POA: Insufficient documentation

## 2021-09-18 LAB — TISSUE TRANSGLUTAMINASE, IGG: tTG,IgG: <2 U/mL (ref 0–5)

## 2021-09-18 LAB — ENDOMYSIAL (EMA) AB IGG: Endomysial IgG: <1:10 {titer}

## 2021-09-18 LAB — PREGNANCY, URINE: Preg Test,UR: NEGATIVE

## 2021-09-18 LAB — ENDOMYSIAL IGA AB: Endomysial IgA AB: <1:10 {titer}

## 2021-09-18 MED ORDER — SODIUM CHLORIDE 0.9 % IV SOLN WRAPPED *I*
30.0000 mL/h | Status: DC
Start: 2021-09-18 — End: 2021-09-19

## 2021-09-18 MED ORDER — SODIUM CHLORIDE 0.9 % IV SOLN WRAPPED *I*
Status: DC | PRN
Start: 2021-09-18 — End: 2021-09-18

## 2021-09-18 MED ORDER — PROPOFOL 10 MG/ML IV EMUL (INTERMITTENT DOSING) WRAPPED *I*
INTRAVENOUS | Status: DC | PRN
Start: 2021-09-18 — End: 2021-09-18
  Administered 2021-09-18: 15 mg via INTRAVENOUS
  Administered 2021-09-18: 100 mg via INTRAVENOUS
  Administered 2021-09-18: 30 mg via INTRAVENOUS

## 2021-09-18 NOTE — Discharge Instructions (Addendum)
Discharge Instructions: Endoscopy      Procedure: Upper Endoscopy      Special Instructions:  You have received medication that has a sedating/amnesic effect and therefore interferes with your memory and normal reaction time. DO NOT operate motor vehicles, machinery or power tools for 24 hours. Do not sign any legal documents for 24 hours. Do not make any major decisions. Do not consume alcohol. Rest at home with only light activity for the rest of today and resume normal activities tomorrow. If you use a CPAP machine for sleep, use your CPAP when you are resting at home after the procedure.  If you develop abdominal pain, nausea or vomiting, fever, chills, bleeding that does not stop, contact your physician.   Call your physician for any persistent changes in bowel habits: (i.e., diarrhea, constipation, bloody stools change in stool shape or size or cramping.     Medications:  Resume all previous medications unless instructed as above.  start Pantoprazole 40 mg BID.  Stop Omeprazole    Follow-Up Care     Follow up Upper Endoscopy in: As needed.  No Aspirin or NSAIDs (ie Mobic, Motrin, Ibuprofen, Naproxen, ect) for 3 days.   Instructional Pamphlet Hiatal Hernia    Diet: Full Liquids for 2 days, then regular diet.    Follow-Up Appointment: an office visit in 4-6 weeks    If You Have A Problem:      Dr. Jonae Renshaw: [phone]    [facility]     Emergency Department  [phone]    [phone]  7AM to 4PM weekdays   24 hours per day      Electronically signed by: Jashaun Penrose M Mera Gunkel, MD

## 2021-09-18 NOTE — Preop H&P (Signed)
[facility] Ambulatory Procedure      PATIENT: Danielle Hall MR #: [id number]   PCP: Monico Hoar, MD DOB: 1980-06-01   DICTATED BY: Dionne Ano, MD PROCEDURE DATE: 09/18/2021       Chief Complaint: GERD and Dysphagia    History of Present Illness  Pt is a 41 y.o.  female presents [facility] for EGD.  Past Medical History:   Diagnosis Date   . Atrial fibrillation    . AVNRT (AV nodal re-entry tachycardia)    . Chalazion of both eyes    . Depression    . DVT (deep venous thrombosis) 2006    post knee surgery     Past Surgical History:   Procedure Laterality Date   . CARDIAC ELECTROPHYSIOLOGY PROCEDURE N/A 10/21/2016    Procedure: Ablation - SVT;  Surgeon: Zetta Bills, MD;  Location: [facility] EP LABS;  Service:    . HERNIA REPAIR     . KNEE SURGERY     . PR CONIZATION CERVIX W/WO D&C RPR ELTRD EXC N/A 09/10/2021    Procedure: CONE BIOPSY, CERVIX, USING LEEP;  Surgeon: Rosina Lowenstein, MD;  Location: [facility] MAIN OR;  Service: OBGYN   . PR IMPLANTATION PT-ACTIVATED CARDIAC EVENT RECORDER N/A 10/22/2016    Procedure: Loop Recorder Implant;  Surgeon: Astrid Divine, MD;  Location: [facility] EP LABS;  Service: Cardiovascular   . PR REMOVAL SUBCUTANEOUS CARDIAC RHYTHM MONITOR N/A 07/07/2021    Procedure: Injectable Loop Explant;  Surgeon: Katherine Roan, NP;  Location: [facility] EP LABS;  Service: Cardiovascular   . TUBAL LIGATION       Allergies   Allergen Reactions   . Erythromycin Other (See Comments)     Unknown-childhood allergy per pt     . Sulfa Antibiotics Other (See Comments)     Unknown childhood allergy     Current Outpatient Medications   Medication   . escitalopram (LEXAPRO) 10 mg tablet   . acetaminophen (TYLENOL) 500 mg tablet   . ibuprofen (ADVIL,MOTRIN) 600 mg tablet   . omeprazole (PRILOSEC) 20 mg capsule   . metoprolol tartrate (LOPRESSOR) 25 mg tablet   . clonazePAM (KLONOPIN) 0.5 MG tablet     No current facility-administered medications for this encounter.     Social History     Socioeconomic History   . Marital  status: Legally Separated   Tobacco Use   . Smoking status: Never   . Smokeless tobacco: Never   Substance and Sexual Activity   . Alcohol use: Yes     Comment: socially   . Drug use: No       Family History   Problem Relation Age of Onset   . Arrhythmia Paternal Grandmother    . Heart Disease Paternal Grandfather    . No Known Problems Mother    . No Known Problems Father    . No Known Problems Sister    . No Known Problems Brother    . No Known Problems Maternal Aunt    . No Known Problems Maternal Uncle    . No Known Problems Paternal Aunt    . No Known Problems Paternal Uncle    . No Known Problems Maternal Grandmother    . No Known Problems Maternal Grandfather    . No Known Problems Other    . Sudden death Neg Hx    . Amblyopia (Lazy Eye) Neg Hx    . Blindness Neg Hx    . Cataracts Neg Hx    .  Diabetes Neg Hx    . Glaucoma Neg Hx    . Macular degeneration Neg Hx    . Retinal detachment Neg Hx    . Strabismus Neg Hx          Review of Systems:  Pertinent items are noted in HPI.    Physical Exam:  Vitals:    09/18/21 0819   BP: (!) 99/46   Pulse: 62   Resp: 14   Temp: 36.5 C (97.7 F)   Weight: 58.1 kg (128 lb)   Height: 160 cm (5\' 3" )     Neuro: A+O x 3  Chest: CTA  Cor: RRR  Abdomen is soft, nontender, nondistended with no palpable masses.     Assessment and Plan:  I discussed the risks, benefits, and alternatives for the procedure.   Plan for EGD    ASA 2  Oral airway satisfactory      Electronically signed by: Dionne Ano, MD

## 2021-09-18 NOTE — Anesthesia Procedure Notes (Signed)
---------------------------------------------------------------------------------------------------------------------------------------    AIRWAY   GENERAL INFORMATION AND STAFF    Patient location during procedure: OR       Date of Procedure: 09/18/2021 8:42 AM  CONDITION PRIOR TO MANIPULATION     Current Airway/Neck Condition:  Normal        For more airway physical exam details, see Anesthesia PreOp Evaluation  AIRWAY METHOD     Patient Position:  Sniffing    Preoxygenated: yes      Induction: IV    Mask Difficulty Assessment:  0 - not attempted    Number of Attempts at Approach:  1  FINAL AIRWAY DETAILS    Final Airway Type:  Nasal cannula    Head position required to avoid obstruction:  Neutral  ----------------------------------------------------------------------------------------------------------------------------------------

## 2021-09-18 NOTE — Anesthesia Postprocedure Evaluation (Signed)
Anesthesia Post-Op Note    Patient: Danielle Hall, Danielle Hall    Procedure(s) Performed:  Procedure Summary  Date:  09/18/2021 Anesthesia Start: 09/18/2021  8:42 AM Anesthesia Stop: 09/18/2021  8:52 AM Room / Location:  * No operating room entered * / [facility] PROC[facility]   * No procedures listed * Diagnosis:  * No pre-op diagnosis entered * Surgeon(s):  Dionne Ano, MD Responsible Anesthesia Provider:  Claiborne Rigg, MD         Recovery Vitals  BP: 94/64 (09/18/2021  8:58 AM)  Heart Rate: 58 (09/18/2021  8:58 AM)  Resp: 16 (09/18/2021  8:58 AM)  Temp: 36.5 C (97.7 F) (09/18/2021  8:19 AM)  SpO2: 99 % (09/18/2021  8:58 AM)  O2 Flow Rate: 5 L/min (09/18/2021  8:48 AM)   0-10 Scale: 0 (09/18/2021  8:58 AM)    Anesthesia type:  MAC  Complications Noted During Procedure or in PACU:  None   Comment:    Patient Location:  ASC  Level of Consciousness:    Awake  Patient Participation:     Able to participate  Temperature Status:    Normothermic  Oxygen Saturation:    Appropriate for condition  Cardiac Status:   Appropriate for condition  Fluid Status:    Stable  Airway Patency:     Yes  Pulmonary Status:    Baseline and stable  Pain Management:    Adequate analgesia and satisfactory to patient  Nausea and Vomiting:  None    Post Op Assessment:    Tolerated procedure well  Responsible Anesthesia Provider Attestation:  All indicated post anesthesia care provided       -

## 2021-09-18 NOTE — Procedures (Addendum)
Procedure Report    [facility] Ambulatory Procedure      PATIENT: Danielle Hall MR #: [id number]   PCP: Monico Hoar, MD DOB: 10-08-1980   DICTATED BY: Dionne Ano, MD PROCEDURE DATE: 09/18/2021       Upper Endoscopy Indication: GERD, Dysphagia and Weight Loss    PCP:  Monico Hoar, MD     The patient presents with dysphagia.  The patient was examined prior to the procedure.    The cardiac exam revealed normal S1/S2 without S3 or murmurs.  The pulmonary exam revealed normal breath sounds.  The patient was deemed an ASA Class 2.    The patient was informed of the risk, benefits and alternatives of the procedure, and her questions were answered to her satisfaction. The patient elected to undergo an EGD with dilitation.     IV Medicines Given During the Procedure:         Propofol 150 mg      The Olympus endoscope was advanced into the esophagus without difficulty. There was no pharyngeal irritation. There were no lesions noted in the esophageal body. There was no evidence of varices or mass lesions. The squamocolumnar junction was encountered at 36 cm from the incisors.       The stomach was entered and carefully inspected. The duodenum was intubated to the third portion and carefully inspected.     The scope was withdrawn back into the antrum and a J maneuver was performed. The air was withdrawn, the scope was withdrawn and the patient tolerated the procedure remarkably well.      The patient underwent diliation. The dialator advanced through the full length of the esophagus and was not forcefully advanced.  There was no blood noted on the dialator after the procedure was completed.    Impression:  1. There was no acute esophagitis, barrett's or masses but biopsies of the GE junction were obtained.      2. Schatzki's ring. S/P dilitation using a 54 F Savory dilator.  3. mild gastritis  4. There was was a small hiatal hernia.    Plan:  1. Will start Pantoprazole 40 mg BID  2. Follow-up: an office visit in 4-6  weeks  Full liquid diet for two days.  Then advance as tolerated**    Electronically signed by: Dionne Ano, MD

## 2021-09-18 NOTE — Anesthesia Preprocedure Evaluation (Addendum)
Anesthesia Pre-operative History and Physical for Danielle Hall    Highlighted Issues for this Procedure:  41 y.o. female presenting for EGD THER W/ANE by Dionne Ano scheduled for 30 minutes.  BMI Readings from Last 1 Encounters:  09/10/21 : 23.03 kg/m                  .  .  Anesthesia Evaluation Information Source: patient, records     ANESTHESIA HISTORY  Pertinent(-):  No History of anesthetic complications or Family hx of anesthetic complications    GENERAL  Pertinent (-):  No obesity, history of anesthetic complications or Family Hx of Anesthetic Complications     PULMONARY  Pertinent(-):  No smoking, recent URI or sleep apnea    CARDIOVASCULAR  Good(4+METs) Exercise Tolerance    + Hx of Dysrhythmias    + Hx of DVT  Pertinent(-):  No past MI    GI/HEPATIC/RENAL   NPO: > 8hrs ago (solids)    Pertinent(-):  No renal issues  NEURO/PSYCH/ORTHO    + Neuropsychiatric Issues    ENDO/OTHER  Pertinent(-):  No diabetes mellitus             Physical Exam    Airway            Mouth opening: normal            Mallampati: II            TM distance (fb): >3 FB            Neck ROM: full            Airway Impression: easy     Cardiovascular           Rhythm: regular           Rate: normal      Neurologic    Normal Exam     Pulmonary     breath sounds clear to auscultation    Mental Status   Normal Exam    oriented to person, place and time         ________________________________________________________________________  PLAN  ASA Score  2  Anesthetic Plan MAC       Induction (routine IV) General Anesthesia/Sedation Maintenance Plan (IV bolus); Airway (nasal cannula); Line ( use current access); Monitoring (standard ASA); Positioning (supine); PONV Plan (ondansetron); Pain (per surgical team and intraop local); PostOp (PACU)Standard Attestation    Informed Consent     Risks:         Risks discussed were commensurate with the plan listed above with the following specific points: N/V, aspiration, sore throat, hypotension,  infection and fatigue, Damage to: eyes and teeth, allergic Rx and unexpected serious injury.    Anesthetic Consent:         Anesthetic plan (and risks as noted above) were discussed with patient    Plan also discussed with team members including:       attending    Responsible Anesthesia Provider Attestation:  I attest that the patient or proxy understands and accepts the risks and benefits of the anesthesia plan. I also attest that I have personally performed a pre-anesthetic examination and evaluation, and prescribed the anesthetic plan for this particular location within 48 hours prior to the anesthetic as documented. Claiborne Rigg, MD  09/18/21, 7:10 AM

## 2021-09-18 NOTE — Anesthesia Case Conclusion (Signed)
CASE CONCLUSION  Emergence  Criteria Used for Airway Removal:  Adequate Tv & RR and acceptable O2 saturation  Assessment:  Routine  Transport  Directly to: ASC  Monitoring:  Pulse oximetry  Position:  Supine  Patient Condition on Handoff  Level of Consciousness:  Mildly sedated  Patient Condition:  Stable  Handoff Report to:  RN

## 2021-09-22 ENCOUNTER — Other Ambulatory Visit: Payer: Self-pay

## 2021-09-22 ENCOUNTER — Ambulatory Visit: Payer: Medicaid Other | Admitting: Gynecologic Oncology

## 2021-09-22 VITALS — BP 100/60 | HR 65 | Temp 97.0°F | Resp 20 | Ht 63.0 in | Wt 127.0 lb

## 2021-09-22 DIAGNOSIS — N920 Excessive and frequent menstruation with regular cycle: Secondary | ICD-10-CM

## 2021-09-22 DIAGNOSIS — N879 Dysplasia of cervix uteri, unspecified: Secondary | ICD-10-CM

## 2021-09-22 DIAGNOSIS — R87619 Unspecified abnormal cytological findings in specimens from cervix uteri: Secondary | ICD-10-CM

## 2021-09-22 NOTE — Progress Notes (Signed)
Subjective:      Patient ID: Danielle Hall is a 41 y.o. female    HPI Danielle Hall s/p LEEP for AGUS. No dysplasia seen and endometrial bx was normal.    However, on this visit Danielle Hall states she has had problems with heavy bleeding for the past few years with clots. This has interfered with her daily life.    Chief Complaint   Patient presents with   . S/P LEEP     No dysplasia on LEEP   . Menorrhagia      Past Medical History:   Diagnosis Date   . Atrial fibrillation    . AVNRT (AV nodal re-entry tachycardia)    . Chalazion of both eyes    . Depression    . DVT (deep venous thrombosis) 2006    post knee surgery      Patient Active Problem List    Diagnosis Date Noted   . S/P LEEP 09/10/2021   . Edema of right lower extremity due to peripheral venous insufficiency 08/21/2021   . Abdominal pain 08/21/2021   . Chalazion of both eyes    . AVNRT (AV nodal re-entry tachycardia) 12/17/2016   . A-fib 08/31/2016   . DVT (deep venous thrombosis) 08/31/2016     Past Surgical History:   Procedure Laterality Date   . CARDIAC ELECTROPHYSIOLOGY PROCEDURE N/A 10/21/2016    Procedure: Ablation - SVT;  Surgeon: Zetta Bills, MD;  Location: [facility] EP LABS;  Service:    . HERNIA REPAIR     . KNEE SURGERY     . PR CONIZATION CERVIX W/WO D&C RPR ELTRD EXC N/A 09/10/2021    Procedure: CONE BIOPSY, CERVIX, USING LEEP;  Surgeon: Rosina Lowenstein, MD;  Location: [facility] MAIN OR;  Service: OBGYN   . PR IMPLANTATION PT-ACTIVATED CARDIAC EVENT RECORDER N/A 10/22/2016    Procedure: Loop Recorder Implant;  Surgeon: Astrid Divine, MD;  Location: [facility] EP LABS;  Service: Cardiovascular   . PR REMOVAL SUBCUTANEOUS CARDIAC RHYTHM MONITOR N/A 07/07/2021    Procedure: Injectable Loop Explant;  Surgeon: Katherine Roan, NP;  Location: [facility] EP LABS;  Service: Cardiovascular   . TUBAL LIGATION       Current Outpatient Medications   Medication   . escitalopram (LEXAPRO) 10 mg tablet   . acetaminophen (TYLENOL) 500 mg tablet   . ibuprofen (ADVIL,MOTRIN) 600 mg tablet   .  metoprolol tartrate (LOPRESSOR) 25 mg tablet   . clonazePAM (KLONOPIN) 0.5 MG tablet   . omeprazole (PRILOSEC) 20 mg capsule     No current facility-administered medications for this visit.     Current Outpatient Medications on File Prior to Visit   Medication Sig Dispense Refill   . escitalopram (LEXAPRO) 10 mg tablet Take 0.5 tablets (5 mg total) by mouth daily  Patient takes 1/2 tablet daily     . acetaminophen (TYLENOL) 500 mg tablet Take 1 tablet (500 mg total) by mouth every 6 hours as needed 30 tablet 0   . ibuprofen (ADVIL,MOTRIN) 600 mg tablet Take 1 tablet (600 mg total) by mouth 3 times daily as needed for Pain 90 tablet 0   . metoprolol tartrate (LOPRESSOR) 25 mg tablet SMARTSIG:1.5 Tablet(s) By Mouth Twice Daily     . clonazePAM (KLONOPIN) 0.5 MG tablet Take 1 tablet (0.5 mg total) by mouth every 4 hours as needed for Anxiety  Take 1/2 -1 tab every 4 hours as needed.     Marland Kitchen omeprazole (PRILOSEC) 20 mg capsule Take 1 capsule (  20 mg total) by mouth daily (before breakfast) (Patient not taking: Reported on 09/09/2021)       No current facility-administered medications on file prior to visit.     Allergies   Allergen Reactions   . Erythromycin Other (See Comments)     Unknown-childhood allergy per pt     . Sulfa Antibiotics Other (See Comments)     Unknown childhood allergy   }     Review of Systems    Review of Systems   Constitutional: Negative.    HENT: Negative.    Eyes: Negative.    Respiratory: Negative.    Cardiovascular: Negative.    Gastrointestinal: Negative.    Endocrine: Negative.    Genitourinary: Negative.    Neurological: Negative.    Hematological: Negative.    Psychiatric/Behavioral: Negative.        Objective:     Vitals:    09/22/21 0828   BP: 100/60   Pulse: 65   Resp: 20   Temp: 36.1 C (97 F)   Weight: 57.6 kg (127 lb)   Height: 160 cm (5\' 3" )               Physical Exam     Physical Exam     BP 100/60 (BP Location: Right arm, Patient Position: Sitting, Cuff Size: adult)   Pulse 65    Temp 36.1 C (97 F) (Tympanic)   Resp 20   Ht 160 cm (5\' 3" )   Wt 57.6 kg (127 lb)   LMP 08/20/2021 (Approximate)   SpO2 99%   BMI 22.50 kg/m     Constitutional: She is oriented to person, place, and time. She appears well-developed and well-nourished.   Head: Normocephalic and atraumatic.   Eyes: Conjunctivae and EOM are normal.   Neck: Normal range of motion.  Cor: regular rate and rhythm without murmur   Pulmonary/Chest: Effort normal, unlabored. Clear to ascultation.  Abdominal: Soft. Not distended   Musculoskeletal: Normal range of motion. No edema noted  Neurological: She is alert and oriented to person, place, and time.   Skin: Skin is without rash.   Psychiatric: She has a normal mood and affect.         Assessment:     S/P LEEP - no dysplasia  Normal endometrial bx but has menorrhagia    Plan:     Pelvic ultrasound  Refer to gyn for discussion of treatment options for menorrhagia  I discussed the proposed treatment plan with the patient and answered all questions.

## 2021-09-23 ENCOUNTER — Other Ambulatory Visit: Payer: Self-pay

## 2021-09-23 ENCOUNTER — Telehealth: Payer: Self-pay

## 2021-09-23 ENCOUNTER — Ambulatory Visit: Payer: Medicaid Other | Admitting: Gynecologic Oncology

## 2021-09-23 DIAGNOSIS — R87619 Unspecified abnormal cytological findings in specimens from cervix uteri: Secondary | ICD-10-CM

## 2021-09-23 NOTE — Telephone Encounter (Signed)
Notification: spoke with patient      Image type & Location: [place] [facility]    Date & Time: 10/01/21 @ 9:30 am arrival     Prep: full bladder  & hold      Follow up Date & Provider: TBD

## 2021-10-01 ENCOUNTER — Ambulatory Visit
Admission: RE | Admit: 2021-10-01 | Discharge: 2021-10-01 | Disposition: A | Discharge status: Home / Self Care | Payer: Medicaid Other | Source: Ambulatory Visit | Attending: Gynecologic Oncology | Admitting: Gynecologic Oncology

## 2021-10-01 DIAGNOSIS — R87619 Unspecified abnormal cytological findings in specimens from cervix uteri: Secondary | ICD-10-CM | POA: Insufficient documentation

## 2021-10-08 LAB — SURGICAL PATHOLOGY

## 2021-11-24 ENCOUNTER — Ambulatory Visit: Payer: Medicaid Other

## 2021-11-24 ENCOUNTER — Telehealth: Payer: Self-pay

## 2021-11-24 ENCOUNTER — Telehealth: Payer: Self-pay | Admitting: Gynecologic Oncology

## 2021-11-24 ENCOUNTER — Other Ambulatory Visit: Payer: Self-pay

## 2021-11-24 VITALS — BP 104/68 | Ht 62.5 in | Wt 137.0 lb

## 2021-11-24 DIAGNOSIS — N920 Excessive and frequent menstruation with regular cycle: Secondary | ICD-10-CM

## 2021-11-24 NOTE — Telephone Encounter (Signed)
Spoke with Dr Whitney Post to inquire about PAPs and cervical cancers screenings.   Awaiting a call back from a nurse.

## 2021-11-24 NOTE — Telephone Encounter (Signed)
Teonia spoke with GYN Onc after leaving the office today.  She was advised that it would be fine for [facility] to continue to see her for her GA's.

## 2021-11-24 NOTE — Telephone Encounter (Signed)
GYN/Onc called to advise that Dr Wayne Both would like [facility] to follow up with the patient for her GA's, PAPs and cervical cancer screenings.

## 2021-11-24 NOTE — Progress Notes (Signed)
[facility] Gynecology Problem Visit    Subjective:    Danielle Hall is a 41 y.o. No obstetric history on file. who presents for establishing care and management of her menorrhagia.     Had annual this year with her primary care ( this summer).   Per pt- Had two papsmears done. ( One and another to confirm HPV)   Was referred to gynonc because of AGUS.    Cone biopsy in July     Referred here for heavy periods.   Has gone to the ER d/t heavy menses.   Menarche-10  Cycles-28 days  Menses -2-4 days  Flow - within the last few years has gotten very heavy. Used to have a light to moderate 2-4 day period.   Uses menstrual cup. Has to change every 15 minutes. Hot dog sized clots.   Associated symptoms- heavy cramping, severe headaches. Ibuprofen helps somewhat.   Had tubal.   Talked to gynonc about ablation or hysterectomy and was directed here.   Does not want any medications. Would like an apt to discuss ablation/ hysterectomy.     Is sexually active. No pain with intercourse.   Denies any menopausal symptoms.   Denies pelvic pains outside of menses.     Rosina Lowenstein, MD   Physician  GYN Oncology  Encounter Date: 09/22/2021  Subjective:     Patient ID: Danielle Hall is a 41 y.o. female  HPI Bettyanne s/p LEEP for AGUS. No dysplasia seen and endometrial bx was normal.    However, on this visit Murry states she has had problems with heavy bleeding for the past few years with clots. This has interfered with her daily life.     Assessment:      S/P LEEP - no dysplasia  Normal endometrial bx but has menorrhagia     Plan:      Pelvic ultrasound  Refer to gyn for discussion of treatment options for menorrhagia  I discussed the proposed treatment plan with the patient and answered all questions.              Narrative   10/01/2021 10:29 AM  PELVIC ULTRASOUND  CLINICAL INFORMATION:  CIN III, R87.619-Unspecified abnormal cytological findings in specimens from cervix uteri.    COMPARISON:  None.    PELVIC ULTRASOUND TECHNIQUE:   Sonographic evaluation of the pelvic organs was performed via transabdominal and transvaginal technique using grayscale ultrasound.  Color flow Doppler ultrasound and spectral Doppler analysis were also utilized.    FINDINGS:    UTERUS:  Size:  5.4 cm AP x 6.0 cm transverse x 11.2 cm in length  Version:  Anteverted  Endometrial Stripe:  0.8 cm Unremarkable  Uterus Comments:  Unremarkable    RIGHT OVARY:  Size: 3.3 cm AP x 2.3 cm transverse x 1.8 cm in length.  Right Ovary Comments:  Unremarkable  Doppler:Arterial and venous flow documented.      LEFT OVARY:  Size: 3.6 cm AP x 4.1 cm transverse x 2.9 cm in length.  Focal Lesions:    Left Ovary Comments: Unremarkable  Doppler: Arterial and venous flow documented.      Cul-De-Sac:  No free fluid visualized  Bladder: Unremarkable  Cervix:  Unremarkable.    Impression      Unremarkable pelvic ultrasound.      END OF IMPRESSION   08/31/2021  SPECIMEN:   Pap TL     SPECIMEN ADEQUACY:   Satisfactory for Evaluation.     GENERAL CATEGORIZATION:  Epithelial Cell Abnormality.     INTERPRETATION/RESULTS:   Atypical glandular cells, not otherwise specified.     INDICATIONS FOR PROCEDURE/CLINICAL HISTORY:   Screening Pap; LMP not given.     Surgical Pathology 09/10/21 [id number]  Additional Copy to:  Illa Level, MD    FINAL DIAGNOSIS:  A.  Uterine cervix, cone biopsy:   -Transformation zone mucosa with inflammatory and reactive changes,  without dysplasia identified.    B.  Uterine endometrium, curetting:   -Secretory phase endometrium.   -No malignancy identified.  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  Received in formalin labeled with the patient's name and "cone  biopsy" is an unoriented rectangular-shaped cervical cone excision (2 x  1.2 x 1.3 cm in depth).  The tan-pink, smooth and glistening  ectocervical mucosa is remarkable for a 0.7 cm  slit-like and patent  endocervical os.  The os is inked blue and the cauterized resection  margin is inked black.  On sectioning, the tan-pink cut surface shows  multiple mucoid-still filled cysts, measuring up to 0.5 cm in greatest  dimension.  There are no other lesions or masses grossly identified.  The specimen is entirely sequentially submitted in four white cassettes  labeled 23-HSP 7690 A1-A4.    Summary of sections: A1-quadrant I, 2 pieces;  A2-quadrant II, 3 pieces;  A3 quadrant III, 3 pieces;  A4-quadrant IV, 3 pieces.    B.  Received in formalin labeled with the patient's name and  "endometrial curettings" is a 2.8 x 1.1 x 0.3 cm aggregate of multiple  irregular pink-red soft tissue fragments admixed with dark red blood  clot and blood-tinged mucoid material.  The specimen is filtered and  entirely submitted in two green cassettes labeled 23-HSP 7690 B1-B2.    Summary of sections: B1-B2-multiple pieces each.    Grossed by: Elson Areas, PA (ASCP)   EAB1       Medical History  Patient's medications, allergies, past medical, surgical, social and family histories were reviewed and updated as appropriate.      Objective:    Physical Exam  BP 104/68 (BP Location: Left arm, Patient Position: Sitting, Cuff Size: adult)   Ht 1.588 m (5' 2.5")   Wt 62.1 kg (137 lb)   LMP 11/10/2021 (Approximate)     Well developed and well-nourished.  A&O x3, no acute distress, appropriate mood and affect.  Unlabored respiratory effort.      Assessment/Plan:    1. Menorrhagia  Will request records from outside office. Some records are available in care everywhere.   Pt following up with MD to discuss ablation and hysterectomy.     We discussed the mirena iud today. Pt was given informational brochure and will consider. Pt is reluctant to use anything involving hormones.   PT will call and verify that gynonc is still managing pts papsmears.           More than 30 minutes (30 - 39 min) 21308 was spent during this  established patient visit between (pre-visit)-reviewing records, patient forms and prior studies, (visit) History, HPI, appropriate ROS, PE, counseling and making a care plan, ordering meds and tests as well as post visit (charting in EMR, coordination of care and following through on visit issues).

## 2021-11-24 NOTE — Telephone Encounter (Signed)
Returned call to [employer], Charity fundraiser from [employer]. Confirmed patient to follow-up with their office regarding PAPs and cervical screening. Appreciative of call.

## 2021-11-24 NOTE — Telephone Encounter (Signed)
Returned call to patient. Confirmed per Dr. Crist Fat last note from 09/22/2021 - 'Refer to gyn for discussion of treatment options for menorrhagia'. Patient saw OBGYN today and will continue follow-ups with their office. Advised patient to have them reach out to our office with any questions.

## 2021-12-16 ENCOUNTER — Other Ambulatory Visit: Payer: Self-pay

## 2022-01-26 NOTE — Progress Notes (Signed)
[facility] Gynecology Problem Visit    Subjective:    Danielle Hall is a 41 y.o. [phone] w/ afib, DVT hx not on anticoagulation who presents for consultation due to menorrhagia. She admits to normal monthly menses q28 days with bleeding for 4 days. They are very heavy requiring her to change a menstrual cup multiple times per hour with saturation. Passes clots the size of a hot dog. Menses associated with severe cramping requiring use of motrin/tylenol, cold sores and headaches. Medications take the edge off. Menses became heavy like this 3-5 years ago.    In the past has tried OCP and Depo for menses regulation in the past however when she was younger and did not have the heavy menses at that time. Depo led to hair loss and irregular bleeding and she would not like to try again. She would not like medication management and is interested in ablation vs hysterectomy. She does not like to introduces chemicals into her body and does not want to introduce hormones into her body.     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 This led to gyn onc referral and LEEP was performed for management. Pathology was benign without dysplasia. D&C was also benign in July.     10/01/2021 USN  FINDINGS:    UTERUS:  Size:  5.4 cm AP x 6.0 cm transverse x 11.2 cm in length  Version:  Anteverted  Endometrial Stripe:  0.8 cm Unremarkable  Uterus Comments:  Unremarkable    RIGHT OVARY:  Size: 3.3 cm AP x 2.3 cm transverse x 1.8 cm in length.  Right Ovary Comments:  Unremarkable  Doppler:Arterial and venous flow documented.      LEFT OVARY:  Size: 3.6 cm AP x 4.1 cm transverse x 2.9 cm in length.  Focal Lesions:    Left Ovary Comments: Unremarkable  Doppler: Arterial and venous flow documented.      Cul-De-Sac:  No free fluid visualized  Bladder: Unremarkable  Cervix:  Unremarkable.    Impression      Unremarkable pelvic ultrasound.       Medical History  Patient's  medications, allergies, past medical, surgical, social and family histories were reviewed and updated as appropriate.      Objective:    Physical Exam  BP 90/60 (BP Location: Right arm, Patient Position: Sitting, Cuff Size: adult)   LMP 01/15/2022 (Within Days)     Well developed and well-nourished.  A&O x3, no acute distress, appropriate mood and affect.  Unlabored respiratory effort.  Abd soft nontender, no mass, no scarring  External genitalia are without abnormalities.  Vagina well rugated, pink, no lesions or redness.  Cervix is without discharge, lesions or friability. No CMT  Uterus:  midline, mobile, and non-tender.  Adnexa are without obvious masses or tenderness.    Assessment/Plan:    1. Menorrhagia  - Hct 39 in August  - Benign D&C in August  - Normal USN in August  - AGUS, HR HPV positive pap June 2023. LEEP benign. Recommend repeat cotesting in 1 year  - TSH ordered  - Discussed management options including expectant manegement, NSAID use, OCP (not recommended due to DVT hx), POP, nexplanon, depo, IUD. Also discussed surgical options including ablation (not recommended in setting of recent AGUS), and hysterectomy. Risks and benefits of all reviewed. She is aware of increased surgical risk in the setting of her  hx of DVT and cardiac disease. Will require medical clearance. Would be able to perform robotically. Reviewed operative and recovery expectations.   - She would like to proceed with expectant management at this time. Will begin using motrin q6 hours during menses and consider IUD placement.     More than 30 minutes (30 - 39 min) 09811 was spent during this established patient visit between (pre-visit)-reviewing records, patient forms and prior studies, (visit) History, HPI, appropriate ROS, PE, counseling and making a care plan, ordering meds and tests as well as post visit (charting in EMR, coordination of care and following through on visit issues).    Gevena Cotton, DO

## 2022-01-27 ENCOUNTER — Encounter: Payer: Self-pay | Admitting: Obstetrics and Gynecology

## 2022-01-27 ENCOUNTER — Other Ambulatory Visit: Payer: Self-pay

## 2022-01-27 ENCOUNTER — Ambulatory Visit: Payer: Medicaid Other | Admitting: Obstetrics and Gynecology

## 2022-01-27 VITALS — BP 90/60

## 2022-01-27 DIAGNOSIS — N920 Excessive and frequent menstruation with regular cycle: Secondary | ICD-10-CM

## 2022-04-02 ENCOUNTER — Encounter: Payer: Self-pay | Admitting: Gastroenterology

## 2022-04-02 ENCOUNTER — Telehealth: Payer: Self-pay | Admitting: Cardiology

## 2022-04-02 NOTE — Telephone Encounter (Signed)
Dental Procedure    Office calling from: [facility]    Name and title of person calling:Carol , Rn    Dentist name: Dr. Huston Foley    Name of procedure: 3 Extractions W/ Bone Graph    Date of procedure: 05/12/22    Fax number of office: [phone]    Phone number of office:[phone]    Any medication hold requests? No           If so, name of medication:           How long would they look to hold the medication? no    Does the patient need antibiotics prior to the procedure?  no

## 2022-04-07 ENCOUNTER — Telehealth: Payer: Self-pay | Admitting: Registered Nurse

## 2022-04-07 NOTE — Telephone Encounter (Signed)
For planned tooth extraction with bone graft on 05/12/22.  May proceed with procedure.  Patient made aware.  Will send to dental office.       Cardiovascular Peri-Operative Risk Stratification    Based upon evidence and ACC/ACH clinical guidelines     Major Active Cardiac Conditions Present?  (unstable coronary syndromes, decompensated CHF, unstable heart rhythm, severe heart valve disease).  NO      Revised Cardiac Risk Index (RCRI) Criteria Score  High Risk Surgery? (Intraperitoneal, Intrathoracic, Vascular) : NONE  History of Ischemic Heart Disease? : NONE  History of Heart Failure? NONE  History of CVA Disease? NONE  Insulin therapy for DM? NONE  Preop serum Cr over 2mg /dl? NONE  Total RCRI Score:  0     RCRI Risk Calculation:  Per RCRI guidelines, the patient has a 0.4% chance of peri-op cardiac complications including MI, VF, cardiac arrest, AV block, or heart failure.     No risk factors - 0.4 percent (95% CI 0.1-0.8 percent)  One risk factor - 1.0 percent (95% CI 0.5-1.4 percent)  Two risk factors - 2.4 percent (95% CI 1.3-3.5 percent)  Three or more risk factors - 5.4 percent (95% CI 2.8-7.9 percent)     Patient is estimated to have functional class of 4-10 METs.      Further cardiac testing indication: is NOT  indicated as the patient has less than 3 RCRI clinical risk factors and demonstrates >4 METS baseline activity and is not undergoing vascular surgery.  Evidence suggests further testing will not improve outcomes.

## 2022-04-07 NOTE — Telephone Encounter (Signed)
Faxed clearance letter to [employer] and Raphael Gibney [phone]) for dental procedure.     Rona Ravens, RN

## 2022-05-27 ENCOUNTER — Telehealth: Payer: Self-pay

## 2022-05-27 NOTE — Telephone Encounter (Signed)
ga// Annual was not done on 11/24/21, had problem visit but appt type was never changed-sb.       4/11 lvm wpt to sched. ga

## 2022-06-28 ENCOUNTER — Telehealth: Payer: Self-pay | Admitting: Cardiology

## 2022-06-28 DIAGNOSIS — I471 Supraventricular tachycardia, unspecified: Secondary | ICD-10-CM

## 2022-06-28 NOTE — Telephone Encounter (Signed)
[facility] PT      Symptom Call:    Who is calling: Kasidy Bundrick    What symptom(s): HEART FLUTTER/FLIP-FLOP RHYTHM, PALPITATIONS     When did it start: going on for a few months, but it seems to be happening more     Any heart rate, blood pressure or weights to report:     The patient was wondering if Dr Renaldo Reel would order a 14 day heart monitor to see what her rhythm is doing.  She is quite concerned about her rhythm.

## 2022-06-29 ENCOUNTER — Ambulatory Visit: Admit: 2022-06-29 | Discharge: 2022-06-29 | Disposition: A | Discharge status: Home / Self Care | Payer: Medicaid Other

## 2022-06-29 ENCOUNTER — Telehealth: Payer: Self-pay

## 2022-06-29 DIAGNOSIS — I471 Supraventricular tachycardia, unspecified: Secondary | ICD-10-CM

## 2022-06-29 NOTE — Telephone Encounter (Signed)
Patient has been enrolled for a cardiac monitor to be mailed out to them at the address on file. Reviewed with the patient, instructions on monitor placement, removal and return. Patient verbalized understanding.

## 2022-06-29 NOTE — Telephone Encounter (Signed)
Returned call to pt to alert her the monitor was sent in the mail, pt demonstrated understanding on proper usage and how to send monitor back at the end. Pt encouraged to call back with questions/concerns.

## 2022-07-07 ENCOUNTER — Other Ambulatory Visit: Payer: Self-pay | Admitting: Cardiology

## 2022-07-07 DIAGNOSIS — R55 Syncope and collapse: Secondary | ICD-10-CM

## 2022-08-13 ENCOUNTER — Other Ambulatory Visit: Payer: Self-pay | Admitting: Gastroenterology

## 2022-08-16 ENCOUNTER — Telehealth: Payer: Self-pay
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
## 2022-08-16 NOTE — Telephone Encounter (Signed)
Spoke with patient regarding her 14 day epatch that was mailed out on 06/29/2022. Per patient she has worn it and just hasn't sent it back. Explained that if she sends it back ASAP we may have results for her 08/23/22 with EP Blue. Pt voiced understanding and will try to send back ASAP

## 2022-08-17 NOTE — Progress Notes (Signed)
[employer] monitor has not been received as of today. Contacted [employer] for further investigation.

## 2022-08-20 DIAGNOSIS — I471 Supraventricular tachycardia, unspecified: Secondary | ICD-10-CM

## 2022-08-23 ENCOUNTER — Ambulatory Visit: Payer: Medicaid Other | Attending: Cardiology | Admitting: Cardiology

## 2022-08-23 ENCOUNTER — Other Ambulatory Visit: Payer: Self-pay

## 2022-08-23 VITALS — BP 102/58 | HR 54 | Ht 63.0 in | Wt 146.0 lb

## 2022-08-23 DIAGNOSIS — R001 Bradycardia, unspecified: Secondary | ICD-10-CM

## 2022-08-23 DIAGNOSIS — I480 Paroxysmal atrial fibrillation: Secondary | ICD-10-CM

## 2022-08-23 DIAGNOSIS — I4719 Other supraventricular tachycardia: Secondary | ICD-10-CM

## 2022-08-23 NOTE — Patient Instructions (Addendum)
You were seen today for evaluation of fast heart rates.     Your EKG today showed normal rhythm with a heart rate of 48.     Continue your current medications as prescribed.     Continue to hydrate and exercise as tolerated.     [phone]

## 2022-08-24 ENCOUNTER — Telehealth: Payer: Self-pay | Admitting: Cardiology

## 2022-08-24 ENCOUNTER — Other Ambulatory Visit: Payer: Self-pay | Admitting: Cardiology

## 2022-08-24 DIAGNOSIS — I4719 Other supraventricular tachycardia: Secondary | ICD-10-CM

## 2022-08-24 LAB — EKG 12-LEAD
P: 72 deg
PR: 173 ms
QRS: 42 deg
QRSD: 78 ms
QT: 468 ms
QTc: 418 ms
Rate: 48 {beats}/min
T: 49 deg

## 2022-08-24 MED ORDER — ATENOLOL 25 MG PO TABS *I*
25.0000 mg | ORAL_TABLET | Freq: Every day | ORAL | 1 refills | Status: DC
Start: 2022-08-24 — End: 2022-10-19

## 2022-08-24 NOTE — Telephone Encounter (Signed)
Medication Question:    Who is calling: Equities trader    What medication:atenolol                              metoprolol    What dose:    What question or concerns: Paramedic stated pt was instructed to start taking atenolol medication and stop taking metoprolol, but neither the pharmacy or PCP office was notify of pt stopping metoprolol medication.    Chuck Pharmacist would like to speak with a nurse.  Phone number:[phone]

## 2022-08-24 NOTE — Telephone Encounter (Signed)
LM for Danielle Hall to call back to discuss recommendation to change from metoprolol 37.5 mg BID to atenolol 25 mg daily.

## 2022-08-24 NOTE — Telephone Encounter (Signed)
Spoke with patient about medication change. She will try the atenolol 25 mg daily. She is very hesitant to change but is willing to try. Sent script into preferred pharmacy.

## 2022-08-24 NOTE — Telephone Encounter (Signed)
Patient returned your call. 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

## 2022-08-24 NOTE — Telephone Encounter (Signed)
Spoke to pharmacy who will stop the metoprolol refills and fill the atenolol.  JDeans RN

## 2022-10-18 ENCOUNTER — Other Ambulatory Visit: Payer: Self-pay | Admitting: Cardiology

## 2022-10-18 DIAGNOSIS — G90A Postural orthostatic tachycardia syndrome (POTS): Secondary | ICD-10-CM

## 2022-10-18 DIAGNOSIS — I4719 Other supraventricular tachycardia: Secondary | ICD-10-CM

## 2023-02-24 ENCOUNTER — Encounter: Payer: Self-pay | Admitting: Obstetrics and Gynecology

## 2023-02-24 ENCOUNTER — Telehealth: Payer: Self-pay | Admitting: Cardiology

## 2023-02-24 ENCOUNTER — Ambulatory Visit
Admission: RE | Admit: 2023-02-24 | Discharge: 2023-02-24 | Disposition: A | Discharge status: Home / Self Care | Payer: Medicaid Other | Source: Ambulatory Visit

## 2023-02-24 ENCOUNTER — Other Ambulatory Visit: Payer: Self-pay

## 2023-02-24 DIAGNOSIS — R002 Palpitations: Secondary | ICD-10-CM

## 2023-02-24 NOTE — Telephone Encounter (Signed)
Spoke with Danielle Hall.     She has been experiencing some palpitations that can occur daily. Described feeling a double to triple beats and then feeling dizzy. She has been relying more on klonopin as the palpitations are triggering PTSD response. She is intolerant to SSRI's. She is wondering if the atenolol isn't working as well?     July 2024 - Metoprolol was discontinued due to concerns of bradycardia and hypotension and pt was transitioned over to atenolol in July. ePatch completed 5/24 with PVC burden of <0.01%.     No blue team openings until mid to late Feb. Pt would be agreeable to wearing a monitor / medication change (she mentioned low dose propranolol).     Dr. Renaldo Reel notified and will return call to pt with his response. She agreed with this plan.

## 2023-02-24 NOTE — Telephone Encounter (Signed)
Symptom Call:    Who is calling: patient     What symptom(s): palpitations, shortness of breath and anxiety attacks    When did it start: within the last  2 months, past few weeks  symptoms have gotten worst     Patient not experiencing symptoms  today  they are still in bed     Any heart rate, blood pressure or weights to report:  BP 114/74  hear rate 76  02/23/23

## 2023-02-24 NOTE — Telephone Encounter (Signed)
Return call to Danielle Hall. Reviewed Dr. Phylliss Bob recommendation for a 14 day monitor.     She agreed with this plan and will call for the results about 1 week after it is mailed back.

## 2023-03-25 DIAGNOSIS — R002 Palpitations: Secondary | ICD-10-CM

## 2023-03-26 NOTE — Progress Notes (Deleted)
 Danielle Hall is a 43 y.o. patient [phone]  w/ afib, DVT hx not on anticoagulation with a LMP of   who presents for an annual exam. Denies fever, chills, chest pain, breast lumps, SOB, N/V, abdominal pain, vaginal discharge/irritation, dysuria, diarrhea, constipation. ***      Patient is sexually active using bilateral salpingectomy for contraception.      normal monthly menses q28 days with bleeding for 4 days. They are very heavy requiring her to change a menstrual cup multiple times per hour with saturation. Passes clots the size of a hot dog. 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 This led to gyn onc referral and LEEP was performed for management. Pathology was benign without dysplasia. D&C was also benign in July.     Mammogram:{[id number]}       Patient's medications, allergies, past medical, surgical, social and family histories were reviewed and updated as appropriate.      Past Medical History:   Diagnosis Date    Atrial fibrillation     AVNRT (AV nodal re-entry tachycardia)     Chalazion of both eyes     Depression     DVT (deep venous thrombosis) 2006    post knee surgery    Past Surgical History:   Procedure Laterality Date    CARDIAC ELECTROPHYSIOLOGY PROCEDURE N/A 10/21/2016    Procedure: Ablation - SVT;  Surgeon: Zetta Bills, MD;  Location: [facility] EP LABS;  Service:     HERNIA REPAIR      umbilical, no use of mesh    KNEE SURGERY      PR CONIZATION CERVIX W/WO D&C RPR ELTRD EXC N/A 09/10/2021    Procedure: CONE BIOPSY, CERVIX, USING LEEP;  Surgeon: Rosina Lowenstein, MD;  Location: [facility] MAIN OR;  Service: OBGYN    PR IMPLANTATION PT-ACTIVATED CARDIAC EVENT RECORDER N/A 10/22/2016    Procedure: Loop Recorder Implant;  Surgeon: Astrid Divine, MD;  Location: [facility] EP LABS;  Service: Cardiovascular    PR REMOVAL SUBCUTANEOUS CARDIAC RHYTHM MONITOR N/A 07/07/2021    Procedure: Injectable Loop Explant;  Surgeon:  Katherine Roan, NP;  Location: [facility] EP LABS;  Service: Cardiovascular    TUBAL LIGATION      UPPER GASTROINTESTINAL ENDOSCOPY  08/2021      Social History     Socioeconomic History    Marital status: Legally Separated   Tobacco Use    Smoking status: Never     Passive exposure: Past    Smokeless tobacco: Never   Substance and Sexual Activity    Alcohol use: Yes     Comment: socially    Drug use: No    Sexual activity: Yes     Partners: Male     Birth control/protection: Surgical     Comment: tubal    Family History   Problem Relation Age of Onset    Irregular heart beat Mother     Other Father         skin issues    PTSD Brother         military    Brain cancer Maternal Grandmother     Throat cancer Maternal Grandfather     Arrhythmia Paternal Grandmother     ALS Paternal Grandmother     Heart Disease Paternal Grandfather     Diabetes Paternal Grandfather     Lung cancer Maternal  Uncle     Macular degeneration Sister         detached retina    Adjustment Disorder with Mixed Anxiety and Depresse* Sister     Sudden death Neg Hx     Amblyopia (Lazy Eye) Neg Hx     Blindness Neg Hx     Cataracts Neg Hx     Glaucoma Neg Hx     Retinal detachment Neg Hx     Strabismus Neg Hx       Current Outpatient Medications   Medication Sig    atenolol (TENORMIN) 25 mg tablet Take 1 tablet by mouth once daily    pantoprazole (PROTONIX) 40 mg EC tablet Take 1 tablet (40 mg total) by mouth 2 times daily.    clonazePAM (KLONOPIN) 0.5 MG tablet Take 1 tablet (0.5 mg total) by mouth every 4 hours as needed for Anxiety. Take 1/2 -1 tab every 4 hours as needed.    Allergies   Allergen Reactions    Erythromycin Other (See Comments)     Unknown-childhood allergy per pt      Sulfa Antibiotics Other (See Comments)     Unknown childhood allergy      Patient Active Problem List   Diagnosis Code    A-fib I48.91    DVT (deep venous thrombosis) I82.409    AVNRT (AV nodal re-entry tachycardia) I47.19    Chalazion of both eyes H00.13, H00.16    Edema  of right lower extremity due to peripheral venous insufficiency I87.2    Abdominal pain R10.9         Physical Exam:  There were no vitals taken for this visit.    GENERAL: alert, oriented, well appearing in NAD.  NECK: supple, No mass or adenopathy  LUNGS: Symmetrical respirations unlabored with no audible wheezes.  BREASTS: No palpable masses or nipple discharge. No skin changes. No axillary or clavicular adenopathy.  ABDOMEN: Soft, non-tender, no palpable masses.  PELVIC:   Normal external female genitalia, vulva, vestibule and  clitoris.   Bartholins, Skenes and urethral glands WNL.  Urethra is normal without prolapse or tenderness.  Vagina with no Atrophy, Cystocele, Rectocele, or lesions.  Cervix: Normal appearing, There is no CMT  Uterus:  midline, mobile, and non-tender. No prolapse.  Adnexa are without obvious masses or tenderness.  Perineum normal.  EXTREMITIES: Bilaterally no edema.      Assessment/Plan:  1. Well woman exam with routine gynecological exam  ***    2. BMI 25.0-25.9,adult  ***    3. History of abnormal cervical Pap smear  ***     Prior mammogram and yearly mammogram encouraged and discussed and assistance offered as needed. Ordered***  Colonoscopy referral available and screening guidelines are STARTING at 45 for low risk woman  COUNSELING provided in areas desired by patient.      Cotesting obtained  S/p LEEP and D&C 2023 - benign    Gevena Cotton, DO

## 2023-03-28 ENCOUNTER — Ambulatory Visit: Payer: Self-pay | Admitting: Obstetrics and Gynecology

## 2023-03-28 DIAGNOSIS — Z01419 Encounter for gynecological examination (general) (routine) without abnormal findings: Secondary | ICD-10-CM

## 2023-03-28 DIAGNOSIS — Z6825 Body mass index (BMI) 25.0-25.9, adult: Secondary | ICD-10-CM

## 2023-03-28 DIAGNOSIS — Z8742 Personal history of other diseases of the female genital tract: Secondary | ICD-10-CM

## 2023-04-20 NOTE — Progress Notes (Unsigned)
 Danielle Hall is a 43 y.o. patient [phone]  w/ afib, DVT hx not on anticoagulation with a LMP of 04/07/2023 who presents for an annual exam. Denies fever, chills, chest pain, breast lumps, SOB, N/V, abdominal pain, vaginal discharge/irritation, dysuria, diarrhea, constipation. Gluten free diet. Limits dairy. No formal exercise regimen.       Patient is sexually active using bilateral salpingectomy for contraception.      normal monthly menses q28 days with bleeding for 4 days. They are very heavy requiring her to change a menstrual cup multiple times per hour with saturation. Passes large clots intermittently. Menses associated with cramping      hx of ASCUS pap in early June 2023 and AGUS, HPV 56/59/66 positive on pap smear in late June 2023. This led to gyn onc referral and LEEP was performed for management. Pathology was benign without dysplasia. D&C was also benign.    Mammogram: benign with dense tissue within the last year [facility]. With [facility].        Patient's medications, allergies, past medical, surgical, social and family histories were reviewed and updated as appropriate.      Past Medical History:   Diagnosis Date    Atrial fibrillation     AVNRT (AV nodal re-entry tachycardia)     Chalazion of both eyes     Depression     DVT (deep venous thrombosis) 2006    post knee surgery    Past Surgical History:   Procedure Laterality Date    CARDIAC ELECTROPHYSIOLOGY PROCEDURE N/A 10/21/2016    Procedure: Ablation - SVT;  Surgeon: Zetta Bills, MD;  Location: [facility] EP LABS;  Service:     HERNIA REPAIR      umbilical, no use of mesh    KNEE SURGERY      PR CONIZATION CERVIX W/WO D&C RPR ELTRD EXC N/A 09/10/2021    Procedure: CONE BIOPSY, CERVIX, USING LEEP;  Surgeon: Rosina Lowenstein, MD;  Location: [facility] MAIN [facility];  Service: OBGYN    PR IMPLANTATION PT-ACTIVATED CARDIAC EVENT RECORDER N/A 10/22/2016    Procedure: Loop Recorder Implant;  Surgeon: Astrid Divine, MD;  Location: [facility] EP LABS;  Service: Cardiovascular    PR REMOVAL  SUBCUTANEOUS CARDIAC RHYTHM MONITOR N/A 07/07/2021    Procedure: Injectable Loop Explant;  Surgeon: Katherine Roan, NP;  Location: [facility] EP LABS;  Service: Cardiovascular    TUBAL LIGATION      UPPER GASTROINTESTINAL ENDOSCOPY  08/2021      Social History     Socioeconomic History    Marital status: Legally Separated   Tobacco Use    Smoking status: Never     Passive exposure: Past    Smokeless tobacco: Never   Substance and Sexual Activity    Alcohol use: Yes     Comment: socially    Drug use: No    Sexual activity: Yes     Partners: Male     Birth control/protection: Surgical     Comment: tubal    Family History   Problem Relation Age of Onset    Irregular heart beat Mother     Other Father         skin issues    PTSD Brother         military    Brain cancer Maternal Grandmother     Throat cancer Maternal Grandfather     Arrhythmia Paternal Grandmother     ALS Paternal Grandmother     Heart Disease Paternal Grandfather  Diabetes Paternal Grandfather     Lung cancer Maternal Uncle     Macular degeneration Sister         detached retina    Adjustment Disorder with Mixed Anxiety and Depresse* Sister     Sudden death Neg Hx     Amblyopia (Lazy Eye) Neg Hx     Blindness Neg Hx     Cataracts Neg Hx     Glaucoma Neg Hx     Retinal detachment Neg Hx     Strabismus Neg Hx       Current Outpatient Medications   Medication Sig    famotidine 40 mg tablet Take by mouth.    atenolol (TENORMIN) 25 mg tablet Take 1 tablet by mouth once daily    pantoprazole (PROTONIX) 40 mg EC tablet Take 1 tablet (40 mg total) by mouth 2 times daily.    clonazePAM (KLONOPIN) 0.5 MG tablet Take 1 tablet (0.5 mg total) by mouth every 4 hours as needed for Anxiety. Take 1/2 -1 tab every 4 hours as needed.    Allergies   Allergen Reactions    Erythromycin Other (See Comments)     Unknown-childhood allergy per pt      Sulfa Antibiotics Other (See Comments)     Unknown childhood allergy      Patient Active Problem List   Diagnosis Code    A-fib  I48.91    DVT (deep venous thrombosis) I82.409    AVNRT (AV nodal re-entry tachycardia) I47.19    Chalazion of both eyes H00.13, H00.16    Edema of right lower extremity due to peripheral venous insufficiency I87.2    Abdominal pain R10.9         Physical Exam:  BP 112/60 (BP Location: Left arm, Patient Position: Sitting, Cuff Size: adult)   Wt 65.8 kg (145 lb)   LMP 04/07/2023     GENERAL: alert, oriented, well appearing in NAD.  NECK: supple, No mass or adenopathy  LUNGS: Symmetrical respirations unlabored with no audible wheezes.  BREASTS: No palpable masses or nipple discharge. No skin changes. No axillary or clavicular adenopathy.  ABDOMEN: Soft, non-tender, no palpable masses.  PELVIC:   Normal external female genitalia, vulva, vestibule and  clitoris.   Bartholins, Skenes and urethral glands WNL.  Urethra is normal without prolapse or tenderness.  Vagina with no Atrophy, Cystocele, Rectocele, or lesions.  Cervix: Normal appearing, There is no CMT  Uterus:  midline, mobile, and non-tender. No prolapse.  Adnexa are without obvious masses or tenderness.  Perineum normal.  EXTREMITIES: Bilaterally no edema.      Assessment/Plan:  1. Well woman exam with routine gynecological exam  Prior mammogram and yearly mammogram encouraged and discussed and assistance offered as needed. Records release  Colonoscopy referral available and screening guidelines are STARTING at 45 for low risk woman  COUNSELING provided in areas desired by patient.      2. BMI 25.0-25.9,adult  Diet and exercise encouraged. Lifestyle changes     3. History of abnormal pap smear  Cotesting obtained  S/p LEEP and D&C 2023 - benign    Gevena Cotton, DO

## 2023-04-21 ENCOUNTER — Other Ambulatory Visit: Payer: Self-pay

## 2023-04-21 ENCOUNTER — Encounter: Payer: Self-pay | Admitting: Obstetrics and Gynecology

## 2023-04-21 ENCOUNTER — Ambulatory Visit: Payer: Self-pay | Attending: Obstetrics and Gynecology | Admitting: Obstetrics and Gynecology

## 2023-04-21 VITALS — BP 112/60 | Wt 145.0 lb

## 2023-04-21 DIAGNOSIS — Z6825 Body mass index (BMI) 25.0-25.9, adult: Secondary | ICD-10-CM | POA: Insufficient documentation

## 2023-04-21 DIAGNOSIS — Z8742 Personal history of other diseases of the female genital tract: Secondary | ICD-10-CM | POA: Insufficient documentation

## 2023-04-21 DIAGNOSIS — Z01419 Encounter for gynecological examination (general) (routine) without abnormal findings: Secondary | ICD-10-CM | POA: Insufficient documentation

## 2023-04-22 LAB — HPV DNA PROBE WITH CYTOLOGY
HPV Other High Risk: NEGATIVE
HPV Type 16: NEGATIVE
HPV Type 18: NEGATIVE

## 2023-04-22 LAB — CHLAMYDIA NAAT (PCR): Chlamydia NAAT (PCR): NEGATIVE

## 2023-04-22 LAB — N. GONORRHOEAE NAAT (PCR): N. gonorrhoeae NAAT (PCR): NEGATIVE

## 2023-05-09 LAB — GYN CYTOLOGY

## 2023-07-27 ENCOUNTER — Other Ambulatory Visit: Payer: Self-pay | Admitting: Cardiology

## 2023-07-27 DIAGNOSIS — I4719 Other supraventricular tachycardia: Secondary | ICD-10-CM

## 2023-08-08 ENCOUNTER — Other Ambulatory Visit: Payer: Self-pay

## 2023-08-08 ENCOUNTER — Encounter: Payer: Self-pay | Admitting: Cardiology

## 2023-08-08 ENCOUNTER — Ambulatory Visit: Payer: Self-pay | Admitting: Cardiology

## 2023-08-08 VITALS — BP 108/51 | HR 60 | Ht 63.0 in | Wt 144.0 lb

## 2023-08-08 DIAGNOSIS — Z789 Other specified health status: Secondary | ICD-10-CM

## 2023-08-08 DIAGNOSIS — R9431 Abnormal electrocardiogram [ECG] [EKG]: Secondary | ICD-10-CM

## 2023-08-08 DIAGNOSIS — R002 Palpitations: Secondary | ICD-10-CM

## 2023-08-08 LAB — EKG 12-LEAD
P: 50 deg
PR: 180 ms
QRS: 44 deg
QRSD: 91 ms
QT: 432 ms
QTc: 418 ms
Rate: 56 {beats}/min
T: 47 deg

## 2023-08-08 MED ORDER — METOPROLOL TARTRATE 25 MG PO TABS *I*
37.5000 mg | ORAL_TABLET | Freq: Two times a day (BID) | ORAL | 5 refills | Status: AC
Start: 2023-08-08 — End: ?

## 2023-08-08 NOTE — Patient Instructions (Signed)
 You were seen today for palpitations.    Will switch back to metoprolol . We will call in script.    Follow up in 1 year.    If questions, please call [phone]
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
## 2024-08-13 ENCOUNTER — Ambulatory Visit: Payer: Self-pay

## 5192-08-15 DEATH — deceased
